# Patient Record
Sex: Female | Born: 1935 | Race: White | Hispanic: No | Smoking: Never smoker
Health system: Southern US, Community
[De-identification: ages and names within clinical notes are randomized; demographics above are authoritative.]

## PROBLEM LIST (undated history)

## (undated) DIAGNOSIS — R42 Dizziness and giddiness: Secondary | ICD-10-CM

## (undated) DIAGNOSIS — Z9889 Other specified postprocedural states: Secondary | ICD-10-CM

## (undated) DIAGNOSIS — R112 Nausea with vomiting, unspecified: Secondary | ICD-10-CM

## (undated) DIAGNOSIS — L719 Rosacea, unspecified: Secondary | ICD-10-CM

## (undated) DIAGNOSIS — M5430 Sciatica, unspecified side: Secondary | ICD-10-CM

## (undated) DIAGNOSIS — T4145XA Adverse effect of unspecified anesthetic, initial encounter: Secondary | ICD-10-CM

## (undated) DIAGNOSIS — IMO0001 Reserved for inherently not codable concepts without codable children: Secondary | ICD-10-CM

## (undated) DIAGNOSIS — M1990 Unspecified osteoarthritis, unspecified site: Secondary | ICD-10-CM

## (undated) DIAGNOSIS — I10 Essential (primary) hypertension: Secondary | ICD-10-CM

## (undated) DIAGNOSIS — E079 Disorder of thyroid, unspecified: Secondary | ICD-10-CM

## (undated) DIAGNOSIS — T8859XA Other complications of anesthesia, initial encounter: Secondary | ICD-10-CM

## (undated) DIAGNOSIS — K219 Gastro-esophageal reflux disease without esophagitis: Secondary | ICD-10-CM

## (undated) HISTORY — PX: TONSILLECTOMY: SUR1361

## (undated) HISTORY — DX: Essential (primary) hypertension: I10

## (undated) HISTORY — DX: Rosacea, unspecified: L71.9

## (undated) HISTORY — PX: BREAST EXCISIONAL BIOPSY: SUR124

## (undated) HISTORY — PX: SHOULDER SURGERY: SHX246

## (undated) HISTORY — PX: KNEE SURGERY: SHX244

## (undated) HISTORY — PX: BUNIONECTOMY: SHX129

## (undated) HISTORY — PX: THYROIDECTOMY: SHX17

## (undated) HISTORY — DX: Dizziness and giddiness: R42

## (undated) HISTORY — DX: Disorder of thyroid, unspecified: E07.9

## (undated) HISTORY — PX: ABDOMINAL HYSTERECTOMY: SHX81

---

## 1998-02-16 ENCOUNTER — Ambulatory Visit (HOSPITAL_BASED_OUTPATIENT_CLINIC_OR_DEPARTMENT_OTHER): Admission: RE | Admit: 1998-02-16 | Discharge: 1998-02-16 | Payer: Self-pay | Admitting: Orthopedic Surgery

## 1998-03-10 ENCOUNTER — Ambulatory Visit (HOSPITAL_COMMUNITY): Admission: RE | Admit: 1998-03-10 | Discharge: 1998-03-10 | Payer: Self-pay | Admitting: Obstetrics & Gynecology

## 1998-06-02 ENCOUNTER — Encounter: Admission: RE | Admit: 1998-06-02 | Discharge: 1998-08-31 | Payer: Self-pay

## 1999-06-03 ENCOUNTER — Encounter: Payer: Self-pay | Admitting: Internal Medicine

## 1999-06-03 ENCOUNTER — Encounter: Admission: RE | Admit: 1999-06-03 | Discharge: 1999-06-03 | Payer: Self-pay | Admitting: Internal Medicine

## 2000-06-06 ENCOUNTER — Encounter: Payer: Self-pay | Admitting: Internal Medicine

## 2000-06-06 ENCOUNTER — Encounter: Admission: RE | Admit: 2000-06-06 | Discharge: 2000-06-06 | Payer: Self-pay | Admitting: Internal Medicine

## 2001-06-10 ENCOUNTER — Encounter: Payer: Self-pay | Admitting: Internal Medicine

## 2001-06-10 ENCOUNTER — Encounter: Admission: RE | Admit: 2001-06-10 | Discharge: 2001-06-10 | Payer: Self-pay | Admitting: Internal Medicine

## 2002-06-13 ENCOUNTER — Encounter: Payer: Self-pay | Admitting: Internal Medicine

## 2002-06-13 ENCOUNTER — Encounter: Admission: RE | Admit: 2002-06-13 | Discharge: 2002-06-13 | Payer: Self-pay | Admitting: Internal Medicine

## 2003-07-08 ENCOUNTER — Encounter: Admission: RE | Admit: 2003-07-08 | Discharge: 2003-07-08 | Payer: Self-pay | Admitting: Internal Medicine

## 2004-07-18 ENCOUNTER — Encounter: Admission: RE | Admit: 2004-07-18 | Discharge: 2004-07-18 | Payer: Self-pay | Admitting: Internal Medicine

## 2004-07-25 ENCOUNTER — Encounter: Admission: RE | Admit: 2004-07-25 | Discharge: 2004-07-25 | Payer: Self-pay | Admitting: Internal Medicine

## 2004-08-10 ENCOUNTER — Encounter: Admission: RE | Admit: 2004-08-10 | Discharge: 2004-08-10 | Payer: Self-pay | Admitting: Internal Medicine

## 2005-01-23 ENCOUNTER — Encounter: Admission: RE | Admit: 2005-01-23 | Discharge: 2005-01-23 | Payer: Self-pay | Admitting: Internal Medicine

## 2005-06-15 ENCOUNTER — Ambulatory Visit (HOSPITAL_COMMUNITY): Admission: RE | Admit: 2005-06-15 | Discharge: 2005-06-15 | Payer: Self-pay | Admitting: Gastroenterology

## 2005-08-15 ENCOUNTER — Encounter: Admission: RE | Admit: 2005-08-15 | Discharge: 2005-08-15 | Payer: Self-pay | Admitting: Internal Medicine

## 2006-09-06 ENCOUNTER — Encounter: Admission: RE | Admit: 2006-09-06 | Discharge: 2006-09-06 | Payer: Self-pay | Admitting: Internal Medicine

## 2006-09-19 ENCOUNTER — Encounter: Admission: RE | Admit: 2006-09-19 | Discharge: 2006-09-19 | Payer: Self-pay | Admitting: Internal Medicine

## 2007-06-24 ENCOUNTER — Encounter: Admission: RE | Admit: 2007-06-24 | Discharge: 2007-06-24 | Payer: Self-pay | Admitting: Orthopedic Surgery

## 2007-07-15 ENCOUNTER — Encounter: Admission: RE | Admit: 2007-07-15 | Discharge: 2007-07-15 | Payer: Self-pay | Admitting: Internal Medicine

## 2007-07-31 ENCOUNTER — Encounter (INDEPENDENT_AMBULATORY_CARE_PROVIDER_SITE_OTHER): Payer: Self-pay | Admitting: Interventional Radiology

## 2007-07-31 ENCOUNTER — Other Ambulatory Visit: Admission: RE | Admit: 2007-07-31 | Discharge: 2007-07-31 | Payer: Self-pay | Admitting: Interventional Radiology

## 2007-07-31 ENCOUNTER — Encounter: Admission: RE | Admit: 2007-07-31 | Discharge: 2007-07-31 | Payer: Self-pay | Admitting: Internal Medicine

## 2007-10-02 ENCOUNTER — Encounter: Admission: RE | Admit: 2007-10-02 | Discharge: 2007-10-02 | Payer: Self-pay | Admitting: Otolaryngology

## 2007-10-22 ENCOUNTER — Encounter: Admission: RE | Admit: 2007-10-22 | Discharge: 2007-10-22 | Payer: Self-pay | Admitting: Family Medicine

## 2007-10-24 ENCOUNTER — Encounter (INDEPENDENT_AMBULATORY_CARE_PROVIDER_SITE_OTHER): Payer: Self-pay | Admitting: Otolaryngology

## 2007-10-24 ENCOUNTER — Observation Stay (HOSPITAL_COMMUNITY): Admission: RE | Admit: 2007-10-24 | Discharge: 2007-10-26 | Payer: Self-pay | Admitting: Otolaryngology

## 2007-11-08 ENCOUNTER — Encounter: Admission: RE | Admit: 2007-11-08 | Discharge: 2007-11-08 | Payer: Self-pay | Admitting: Otolaryngology

## 2008-03-13 ENCOUNTER — Ambulatory Visit (HOSPITAL_COMMUNITY): Admission: RE | Admit: 2008-03-13 | Discharge: 2008-03-13 | Payer: Self-pay | Admitting: Otolaryngology

## 2008-10-22 ENCOUNTER — Encounter: Admission: RE | Admit: 2008-10-22 | Discharge: 2008-10-22 | Payer: Self-pay | Admitting: Internal Medicine

## 2009-11-02 ENCOUNTER — Encounter: Admission: RE | Admit: 2009-11-02 | Discharge: 2009-11-02 | Payer: Self-pay | Admitting: Internal Medicine

## 2010-09-04 ENCOUNTER — Encounter: Payer: Self-pay | Admitting: Internal Medicine

## 2010-10-07 ENCOUNTER — Other Ambulatory Visit: Payer: Self-pay | Admitting: Internal Medicine

## 2010-10-07 DIAGNOSIS — Z1231 Encounter for screening mammogram for malignant neoplasm of breast: Secondary | ICD-10-CM

## 2010-11-04 ENCOUNTER — Ambulatory Visit
Admission: RE | Admit: 2010-11-04 | Discharge: 2010-11-04 | Disposition: A | Discharge status: Home / Self Care | Payer: PRIVATE HEALTH INSURANCE | Source: Ambulatory Visit | Attending: Internal Medicine | Admitting: Internal Medicine

## 2010-11-04 DIAGNOSIS — Z1231 Encounter for screening mammogram for malignant neoplasm of breast: Secondary | ICD-10-CM

## 2010-12-27 NOTE — Op Note (Signed)
NAMEKYLE, STANSELL                ACCOUNT NO.:  [id number]   MEDICAL RECORD NO.:  [id number]          PATIENT TYPE:  AMB   LOCATION:  SDS                          FACILITY:  [facility]   PHYSICIAN:  Jefry H. Pollyann Kennedy, MD     DATE OF BIRTH:  1926/05/31   DATE OF PROCEDURE:  03/13/2008  DATE OF DISCHARGE:  03/13/2008                               OPERATIVE REPORT   PREOPERATIVE DIAGNOSIS:  Left vocal cord paralysis with laryngeal  incompetence.   POSTOPERATIVE DIAGNOSIS:  Left vocal cord paralysis with laryngeal  incompetence.   PROCEDURE:  Injection of laryngoplasty of left vocal cord using Radiesse  brand hydroxyapatite injection.   SURGEON:  Jefry H. Pollyann Kennedy, MD   ANESTHESIA:  General endotracheal anesthesia was used.   COMPLICATIONS:  None.   BLOOD LOSS:  None.   FINDINGS:  Left paralyzed vocal cords in the paramedian position.  At  the termination of the procedure, there was nice fullness of the cord  with a very tiny posterior glottic chink still remaining.  No  complications.   HISTORY:  This is a 77 year old who underwent completion thyroidectomy  several months back.  She has had a left vocal cord paralysis and severe  breathiness to her voice since then.  Risks, benefits, alternatives, and  complications of the procedure were explained to the patient, who seem  to understand and agreed with surgery.   PROCEDURE:  The patient was taken to the operating room and placed on  the operating table in supine position.  Following induction of general  endotracheal anesthesia, the table was turned 90 degrees and the patient  was draped in the standard fashion.  A maxillary tooth protector was  used.  A Jako laryngoscope was used to view the larynx and this was  carried to the mediastinum using suspension apparatus.  The endotracheal  tube was removed and then replaced as needed during the case.  Using  operative technique, the radius injection system was used to inject in  two  sites, one in the mid portion of the vocal cord deep to the lamina  propria of the cord and second place was posteriorly along the retinoid  vocal process.  Total of 0.2 mL was injected.  There was nice fullness  of the cord, nice even contacting surface and a very small posterior  glottic chink remaining while the cords were in the midline position.  The laryngoscope was removed.  The patient was reintubated for the  termination of the procedures.  She was allowed to ventilate, awakened  from anesthesia, and was extubated.      Jefry H. Pollyann Kennedy, MD  Electronically Signed    JHR/MEDQ  D:  03/13/2008  T:  03/14/2008  Job:  [id number]

## 2010-12-27 NOTE — Op Note (Signed)
NAMECURTINA, GRILLS                ACCOUNT NO.:  [id number]   MEDICAL RECORD NO.:  [id number]          PATIENT TYPE:  OIB   LOCATION:  5125                         FACILITY:  [facility]   PHYSICIAN:  Jefry H. Pollyann Kennedy, MD     DATE OF BIRTH:  18-Jan-1926   DATE OF PROCEDURE:  DATE OF DISCHARGE:  10/26/2007                               OPERATIVE REPORT   PREOPERATIVE DIAGNOSIS:  Left thyroid mass.   POSTOPERATIVE DIAGNOSIS:  Left thyroid mass.   PROCEDURE:  Left completion thyroidectomy.   SURGEON:  Jefry H. Pollyann Kennedy, MD   ASSISTANT:  Suzanna Obey, M.D.   ANESTHESIA:  General endotracheal.   COMPLICATIONS:  None.   BLOOD LOSS:  Minimal.   FINDINGS:  Multilobulated left-sided thyroid mass totaling approximately  6-cm.  A punitive inferior parathyroid gland was identified and  preserved with its blood supply.  The recurrent nerve was not identified  separately.   HISTORY:  This is a 77 year old lady who underwent right-sided  thyroidectomy for benign disease about 20 years previously.  She has had  an enlarging mass on the left side that has caused compression of the  trachea and some difficulty with shortness of breath.  Risks, benefits,  alternatives, and complications of the procedure explained to the  patient and she seemed to understand and agreed to the surgery.   PROCEDURE:  The patient was taken to the operating room and placed on  the operating table in the supine position.  Following induction of  general endotracheal anesthesia, the neck was prepped and draped in the  standard fashion.  The previous incision scar was used and  electrocautery was used for incision of the skin and subcutaneous  tissue.  Subplatysmal flaps were developed superiorly to the thyroid  notch and inferiorly to the clavicle.  A self-retaining thyroid  retractor was used throughout the case.  The midline fascia was divided.  The strap muscles were dissected laterally on the left side off of the  thyroid  gland.  The thyroid gland was grasped with Babcock forceps and  was retracted medially while the dissection was accomplished.  The  middle thyroid vein was ligated between clamps and divided.  The  superior vasculature was separately identified, dissected, and ligated  between clamps and divided.  The dissection continued down right on the  capsule working inferiorly towards the inferior vasculature.  The  punitive parathyroid gland was thus identified and preserved with blood  supply.  It remained healthy and viable appearing throughout the  procedure.  The ligament of Allyson Sabal was divided using electrocautery.  The  specimen was delivered from the wound.  The wound was irrigated with  saline and bipolar cautery was used for completion of hemostasis.  The  wound was closed in layers using 4-0 chromic on the midline fascia in  the platysmal layer and  Dermabond on the skin.  A 7 [place] round JP drain was left in the wound  and exited through the right side of the incision and secured in place  with a nylon suture.  The patient was awakened, extubated,  and  transferred to recovery in stable condition.      Jefry H. Pollyann Kennedy, MD  Electronically Signed     JHR/MEDQ  D:  10/24/2007  T:  10/24/2007  Job:  [id number]   cc:   Suzanna Obey, M.D.

## 2010-12-30 NOTE — Op Note (Signed)
NAMEOTILIA, Pamela Braun                ACCOUNT NO.:  [id number]   MEDICAL RECORD NO.:  [id number]          PATIENT TYPE:  AMB   LOCATION:  ENDO                         FACILITY:  [facility]   PHYSICIAN:  James L. Malon Kindle., M.D.DATE OF BIRTH:  1925/12/02   DATE OF PROCEDURE:  06/15/2005  DATE OF DISCHARGE:                                 OPERATIVE REPORT   PROCEDURE:  Colonoscopy.   MEDICATIONS:  Not given to me.  Patient did receive a large amount of  medications.  Please see the nurse's notes for the exact amount.   SCOPE:  Pediatric adjustable scope.   INDICATIONS:  Previous history of adenomatous polyp removed.   DESCRIPTION OF PROCEDURE:  The procedure has been explained to the patient  and consent obtained.  In left lateral decubitus position, the pediatric  adjustable scope was inserted.  The patient had scattered diverticula.  A  long, tortuous colon.  Multiple maneuvers were required.  We had to place  her in the supine and in the right lateral decubitus position using  abdominal pressure.  Back in the supine, back in the left lateral decubitus  position.  Ultimately, we were able to work our way to the cecum.  The  ileocecal valve and appendiceal orifice seen.  The scope was withdrawn.  The  colon carefully examined.  No polyps seen throughout the entire colon.  There was a fairly long, tortuous colon and only scattered diverticula were  seen.  The scope was withdrawn.  The patient was resting comfortably at the  termination of the procedure.   ASSESSMENT:  A history of colon polyps with a negative colonoscopy at this  time.   Due to her age and the fact that she will be over 82 at the time we would  normally do another five year colonoscopy, we will recommend yearly  Hemoccults and repeat colonoscopies for specific indications, such as heme-  positive stools.           ______________________________  Llana Aliment Malon Kindle., M.D.     Waldron Session  D:  06/15/2005  T:   06/15/2005  Job:  [id number]   cc:   Larina Earthly, M.D.  Fax: [phone]

## 2011-03-27 ENCOUNTER — Other Ambulatory Visit: Payer: Self-pay | Admitting: Family Medicine

## 2011-03-27 ENCOUNTER — Ambulatory Visit
Admission: RE | Admit: 2011-03-27 | Discharge: 2011-03-27 | Disposition: A | Discharge status: Home / Self Care | Payer: PRIVATE HEALTH INSURANCE | Source: Ambulatory Visit | Attending: Family Medicine | Admitting: Family Medicine

## 2011-03-27 DIAGNOSIS — R0602 Shortness of breath: Secondary | ICD-10-CM

## 2011-05-08 LAB — CALCIUM
Calcium: 7.4 — ABNORMAL LOW
Calcium: 7.7 — ABNORMAL LOW
Calcium: 7.9 — ABNORMAL LOW
Calcium: 8 — ABNORMAL LOW
Calcium: 8 — ABNORMAL LOW

## 2011-05-08 LAB — BASIC METABOLIC PANEL
BUN: 18
CO2: 29
Calcium: 9.1
Chloride: 104
Creatinine, Ser: 0.81
GFR calc Af Amer: >60
GFR calc non Af Amer: >60
Glucose, Bld: 99
Potassium: 4.4
Sodium: 141

## 2011-05-08 LAB — CBC
HCT: 39.5
Hemoglobin: 13.3
MCHC: 33.7
MCV: 81.3
Platelets: 211
RBC: 4.86
RDW: 13.6
WBC: 7.6

## 2011-05-12 LAB — CBC
Platelets: 183
WBC: 6.6

## 2011-05-12 LAB — BASIC METABOLIC PANEL
BUN: 21
Creatinine, Ser: 0.83
GFR calc non Af Amer: >60

## 2011-10-16 ENCOUNTER — Other Ambulatory Visit: Payer: Self-pay | Admitting: Family Medicine

## 2011-10-16 DIAGNOSIS — Z1231 Encounter for screening mammogram for malignant neoplasm of breast: Secondary | ICD-10-CM

## 2011-11-08 ENCOUNTER — Ambulatory Visit: Payer: PRIVATE HEALTH INSURANCE

## 2011-11-09 ENCOUNTER — Ambulatory Visit
Admission: RE | Admit: 2011-11-09 | Discharge: 2011-11-09 | Disposition: A | Discharge status: Home / Self Care | Payer: PRIVATE HEALTH INSURANCE | Source: Ambulatory Visit | Attending: Family Medicine | Admitting: Family Medicine

## 2011-11-09 DIAGNOSIS — Z1231 Encounter for screening mammogram for malignant neoplasm of breast: Secondary | ICD-10-CM

## 2012-04-26 ENCOUNTER — Ambulatory Visit
Admission: RE | Admit: 2012-04-26 | Discharge: 2012-04-26 | Disposition: A | Discharge status: Home / Self Care | Payer: Medicare Other | Source: Ambulatory Visit | Attending: Family Medicine | Admitting: Family Medicine

## 2012-04-26 ENCOUNTER — Other Ambulatory Visit: Payer: Self-pay | Admitting: Family Medicine

## 2012-04-26 DIAGNOSIS — M79606 Pain in leg, unspecified: Secondary | ICD-10-CM

## 2012-04-26 DIAGNOSIS — R609 Edema, unspecified: Secondary | ICD-10-CM

## 2012-04-26 DIAGNOSIS — O2230 Deep phlebothrombosis in pregnancy, unspecified trimester: Secondary | ICD-10-CM

## 2012-10-21 ENCOUNTER — Other Ambulatory Visit: Payer: Self-pay

## 2012-10-21 ENCOUNTER — Other Ambulatory Visit: Payer: Self-pay | Admitting: Family Medicine

## 2012-10-21 DIAGNOSIS — Z1231 Encounter for screening mammogram for malignant neoplasm of breast: Secondary | ICD-10-CM

## 2012-10-21 DIAGNOSIS — R102 Pelvic and perineal pain: Secondary | ICD-10-CM

## 2012-10-22 ENCOUNTER — Ambulatory Visit
Admission: RE | Admit: 2012-10-22 | Discharge: 2012-10-22 | Disposition: A | Discharge status: Home / Self Care | Payer: Medicare Other | Source: Ambulatory Visit | Attending: Family Medicine | Admitting: Family Medicine

## 2012-10-22 DIAGNOSIS — R102 Pelvic and perineal pain: Secondary | ICD-10-CM

## 2012-11-11 ENCOUNTER — Ambulatory Visit
Admission: RE | Admit: 2012-11-11 | Discharge: 2012-11-11 | Disposition: A | Discharge status: Home / Self Care | Payer: Medicare Other | Source: Ambulatory Visit

## 2012-11-11 DIAGNOSIS — Z1231 Encounter for screening mammogram for malignant neoplasm of breast: Secondary | ICD-10-CM

## 2013-06-20 ENCOUNTER — Ambulatory Visit
Admission: RE | Admit: 2013-06-20 | Discharge: 2013-06-20 | Disposition: A | Discharge status: Home / Self Care | Payer: Medicare Other | Source: Ambulatory Visit | Attending: Family Medicine | Admitting: Family Medicine

## 2013-06-20 ENCOUNTER — Other Ambulatory Visit: Payer: Self-pay | Admitting: Family Medicine

## 2013-06-20 DIAGNOSIS — R059 Cough, unspecified: Secondary | ICD-10-CM

## 2013-06-20 DIAGNOSIS — R05 Cough: Secondary | ICD-10-CM

## 2013-10-21 ENCOUNTER — Other Ambulatory Visit: Payer: Self-pay

## 2013-10-21 DIAGNOSIS — Z1231 Encounter for screening mammogram for malignant neoplasm of breast: Secondary | ICD-10-CM

## 2013-11-13 ENCOUNTER — Ambulatory Visit
Admission: RE | Admit: 2013-11-13 | Discharge: 2013-11-13 | Disposition: A | Discharge status: Home / Self Care | Payer: Medicare HMO | Source: Ambulatory Visit

## 2013-11-13 DIAGNOSIS — Z1231 Encounter for screening mammogram for malignant neoplasm of breast: Secondary | ICD-10-CM

## 2014-09-24 ENCOUNTER — Encounter: Payer: Self-pay | Admitting: Neurology

## 2014-09-24 ENCOUNTER — Ambulatory Visit (INDEPENDENT_AMBULATORY_CARE_PROVIDER_SITE_OTHER): Payer: Medicare HMO | Admitting: Neurology

## 2014-09-24 VITALS — BP 172/81 | HR 67 | Ht 63.0 in | Wt 182.0 lb

## 2014-09-24 DIAGNOSIS — H8112 Benign paroxysmal vertigo, left ear: Secondary | ICD-10-CM

## 2014-09-24 NOTE — Progress Notes (Signed)
PATIENT: Pamela Braun DOB: 1926-05-12  HISTORICAL  Pamela Braun is a 81 year old right-handed female, referred by Dr. Melford Aase for evaluation of vertigo episodes.  She has history of hypothyroidism, hypertension, take daily aspirin.  She presented with of vertigo since January 2016, most noticeable with sudden positional change, such as turning in her bed to the left side, intense spinning sensation lasting for a few minutes, sometimes with nausea, no visual change, no gait change, she has some gait difficulty due to her low back pain, bilateral shoulder pain.  Her symptoms are intermittent, no hearing loss, no visual change, no lateralized motor or sensory deficit.  I have reviewed her MRI in February first 2016, moderate atrophy, periventricular small vessel disease, no acute lesions.  REVIEW OF SYSTEMS: Full 14 system review of systems performed and notable only for fatigue, shortness of breath, easy bruising, easy bleeding, joint pain, dizziness, restless leg.  ALLERGIES: Allergies  Allergen Reactions  . Anesthetics, Amide Nausea And Vomiting  . Celecoxib Rash  . Codeine Nausea And Vomiting  . Ibuprofen Swelling  . Meperidine Nausea And Vomiting  . Naproxen Swelling  . Sulfamethoxazole-Trimethoprim Rash  . Naloxone Nausea And Vomiting  . Pentazocine Nausea And Vomiting  . Azithromycin Photosensitivity    HOME MEDICATIONS: Current Outpatient Prescriptions  Medication Sig Dispense Refill  . acetaminophen (TYLENOL) 500 MG tablet Take 500 mg by mouth.    Marland Kitchen aspirin 81 MG chewable tablet Chew 81 mg by mouth.    . Calcium-Vitamin D-Vitamin K 500-500-40 MG-UNT-MCG CHEW Chew 500 mg by mouth.    . diazepam (VALIUM) 2 MG tablet Take 2 mg by mouth as needed.    . diclofenac sodium (VOLTAREN) 1 % GEL Place onto the skin as needed.    Marland Kitchen levothyroxine (SYNTHROID, LEVOTHROID) 137 MCG tablet Take 137 mcg by mouth.    . metoprolol (LOPRESSOR) 50 MG tablet Take 50 mg by mouth.    .  Multiple Vitamins-Minerals (VISION-VITE PRESERVE PO) Take by mouth.    . promethazine (PHENERGAN) 25 MG tablet Take 25 mg by mouth.    . ranitidine (ZANTAC) 150 MG tablet Take 150 mg by mouth.     No current facility-administered medications for this visit.    PAST MEDICAL HISTORY: Past Medical History  Diagnosis Date  . Hypertension   . Vertigo   . Rosacea   . Thyroid disease     PAST SURGICAL HISTORY: Past Surgical History  Procedure Laterality Date  . Thyroidectomy    . Shoulder surgery      Bilateral  . Knee surgery      Right x 2  . Tonsillectomy      FAMILY HISTORY: No family history on file.  SOCIAL HISTORY:  History   Social History  . Marital Status: Widowed    Spouse Name: N/A  . Number of Children: 4  . Years of Education: 12   Occupational History  . Retired    Social History Main Topics  . Smoking status: Never Smoker   . Smokeless tobacco: Not on file  . Alcohol Use: No  . Drug Use: No  . Sexual Activity: Not on file   Other Topics Concern  . Not on file   Social History Narrative   Lives at home.   Her daughter lives with her.   Right-handed.   2 -3 cups caffeine/day     PHYSICAL EXAM   Filed Vitals:   09/24/14 1003  BP: 172/81  Pulse: 67  Height: 5'  3" (1.6 m)  Weight: 182 lb (82.555 kg)    Not recorded      Body mass index is 32.25 kg/(m^2).   Generalized: In no acute distress  Neck: Supple, no carotid bruits   Cardiac: Regular rate rhythm  Pulmonary: Clear to auscultation bilaterally  Musculoskeletal: Limited range of motion of bilateral shoulders.  Neurological examination  Mentation: Alert oriented to time, place, history taking, and causual conversation  Cranial nerve II-XII: Pupils were equal round reactive to light. Extraocular movements were full.  Visual field were full on confrontational test. Bilateral fundi were sharp.  Facial sensation and strength were normal. Hearing was intact to finger rubbing  bilaterally. Uvula tongue midline.  Head turning and shoulder shrug and were normal and symmetric.Tongue protrusion into cheek strength was normal.  Motor: Normal tone, bulk and strength.  Sensory: Intact to fine touch, pinprick, preserved vibratory sensation, and proprioception at toes.  Coordination: Normal finger to nose, heel-to-shin bilaterally there was no truncal ataxia  Gait: Rising up from seated position by pushing on a chair arm, cautious, antalgic gait   Romberg signs: Negative  Deep tendon reflexes: Brachioradialis 2/2, biceps 2/2, triceps 2/2, patellar 2/2, Achilles 2/2, plantar responses were flexor bilaterally.  I performed Apley's maneuver, with left ear dependent position, she developed transient dizziness, I was able to appreciate short lasting rotatory nystagmus, quickly habituate,  DIAGNOSTIC DATA (LABS, IMAGING, TESTING) - I reviewed patient records, labs, notes, testing and imaging myself where available.  Lab Results  Component Value Date   WBC 6.6 03/11/2008   HGB 12.4 03/11/2008   HCT 37.2 03/11/2008   MCV 83.4 03/11/2008   PLT 183 03/11/2008      Component Value Date/Time   NA 140 03/11/2008 0940   K 4.6 03/11/2008 0940   CL 106 03/11/2008 0940   CO2 28 03/11/2008 0940   GLUCOSE 99 03/11/2008 0940   BUN 21 03/11/2008 0940   CREATININE 0.83 03/11/2008 0940   CALCIUM 8.6 03/11/2008 0940   GFRNONAA >60 03/11/2008 0940   GFRAA  03/11/2008 0940    >60        The eGFR has been calculated using the MDRD equation. This calculation has not been validated in all clinical   No results found for: CHOL, HDL, LDLCALC, LDLDIRECT, TRIG, CHOLHDL No results found for: HGBA1C No results found for: VITAMINB12 No results found for: TSH    ASSESSMENT AND PLAN  Pamela Braun is a 81 y.o. female  with vascular risk factor of age, hypertension, hypothyroidism, presenting with intermittent intense of vertigo, positional related, triggered by left ear dependent  position, consistent with benign positional vertigo, I was able to trigger vertigo, and nystagmus,MRI of brain showed moderate atrophy, small vessel disease  Repeat repositional maneuver Return to clinic in one month   Marcial Pacas, M.D. Ph.D.  [facility] [address], [place], [place] [address] Ph: [phone] Fax: [phone]

## 2014-09-24 NOTE — Patient Instructions (Signed)
Benign Positional Vertigo Vertigo means you feel like you or your surroundings are moving when they are not. Benign positional vertigo is the most common form of vertigo. Benign means that the cause of your condition is not serious. Benign positional vertigo is more common in older adults. CAUSES  Benign positional vertigo is the result of an upset in the labyrinth system. This is an area in the middle ear that helps control your balance. This may be caused by a viral infection, head injury, or repetitive motion. However, often no specific cause is found. SYMPTOMS  Symptoms of benign positional vertigo occur when you move your head or eyes in different directions. Some of the symptoms may include:  Loss of balance and falls.  Vomiting.  Blurred vision.  Dizziness.  Nausea.  Involuntary eye movements (nystagmus). DIAGNOSIS  Benign positional vertigo is usually diagnosed by physical exam. If the specific cause of your benign positional vertigo is unknown, your caregiver may perform imaging tests, such as magnetic resonance imaging (MRI) or computed tomography (CT). TREATMENT  Your caregiver may recommend movements or procedures to correct the benign positional vertigo. Medicines such as meclizine, benzodiazepines, and medicines for nausea may be used to treat your symptoms. In rare cases, if your symptoms are caused by certain conditions that affect the inner ear, you may need surgery. HOME CARE INSTRUCTIONS   Follow your caregiver's instructions.  Move slowly. Do not make sudden body or head movements.  Avoid driving.  Avoid operating heavy machinery.  Avoid performing any tasks that would be dangerous to you or others during a vertigo episode.  Drink enough fluids to keep your urine clear or pale yellow. SEEK IMMEDIATE MEDICAL CARE IF:   You develop problems with walking, weakness, numbness, or using your arms, hands, or legs.  You have difficulty speaking.  You develop  severe headaches.  Your nausea or vomiting continues or gets worse.  You develop visual changes.  Your family or friends notice any behavioral changes.  Your condition gets worse.  You have a fever.  You develop a stiff neck or sensitivity to light. MAKE SURE YOU:   Understand these instructions.  Will watch your condition.  Will get help right away if you are not doing well or get worse. Document Released: 05/08/2006 Document Revised: 10/23/2011 Document Reviewed: 04/20/2011 Exit[employer] Patient Information 2015 [employer]. This information is not intended to replace advice given to you by your health care provider. Make sure you discuss any questions you have with your health care provider.    

## 2014-10-29 ENCOUNTER — Ambulatory Visit: Payer: Medicare HMO | Admitting: Neurology

## 2014-11-03 ENCOUNTER — Other Ambulatory Visit: Payer: Self-pay

## 2014-11-03 DIAGNOSIS — Z1231 Encounter for screening mammogram for malignant neoplasm of breast: Secondary | ICD-10-CM

## 2014-11-16 ENCOUNTER — Ambulatory Visit: Payer: Medicare HMO

## 2014-11-23 ENCOUNTER — Ambulatory Visit
Admission: RE | Admit: 2014-11-23 | Discharge: 2014-11-23 | Disposition: A | Discharge status: Home / Self Care | Payer: Medicare HMO | Source: Ambulatory Visit

## 2014-11-23 DIAGNOSIS — Z1231 Encounter for screening mammogram for malignant neoplasm of breast: Secondary | ICD-10-CM

## 2015-08-14 ENCOUNTER — Encounter (HOSPITAL_COMMUNITY)
Admission: EM | Disposition: A | Discharge status: Home Health | Payer: Self-pay | Source: Home / Self Care | Attending: Emergency Medicine

## 2015-08-14 ENCOUNTER — Emergency Department (HOSPITAL_COMMUNITY): Payer: Medicare HMO | Admitting: Anesthesiology

## 2015-08-14 ENCOUNTER — Emergency Department (HOSPITAL_COMMUNITY): Payer: Medicare HMO

## 2015-08-14 ENCOUNTER — Encounter (HOSPITAL_COMMUNITY): Payer: Self-pay | Admitting: *Deleted

## 2015-08-14 ENCOUNTER — Observation Stay (HOSPITAL_COMMUNITY)
Admission: EM | Admit: 2015-08-14 | Discharge: 2015-08-18 | Disposition: A | Discharge status: Home Health | Payer: Medicare HMO | Attending: Orthopedic Surgery | Admitting: Orthopedic Surgery

## 2015-08-14 DIAGNOSIS — W010XXA Fall on same level from slipping, tripping and stumbling without subsequent striking against object, initial encounter: Secondary | ICD-10-CM | POA: Insufficient documentation

## 2015-08-14 DIAGNOSIS — M1712 Unilateral primary osteoarthritis, left knee: Secondary | ICD-10-CM | POA: Diagnosis not present

## 2015-08-14 DIAGNOSIS — E039 Hypothyroidism, unspecified: Secondary | ICD-10-CM | POA: Insufficient documentation

## 2015-08-14 DIAGNOSIS — M179 Osteoarthritis of knee, unspecified: Secondary | ICD-10-CM | POA: Diagnosis not present

## 2015-08-14 DIAGNOSIS — E669 Obesity, unspecified: Secondary | ICD-10-CM | POA: Diagnosis not present

## 2015-08-14 DIAGNOSIS — S52502A Unspecified fracture of the lower end of left radius, initial encounter for closed fracture: Secondary | ICD-10-CM

## 2015-08-14 DIAGNOSIS — R202 Paresthesia of skin: Secondary | ICD-10-CM

## 2015-08-14 DIAGNOSIS — T79A12A Traumatic compartment syndrome of left upper extremity, initial encounter: Secondary | ICD-10-CM | POA: Insufficient documentation

## 2015-08-14 DIAGNOSIS — Y92009 Unspecified place in unspecified non-institutional (private) residence as the place of occurrence of the external cause: Secondary | ICD-10-CM | POA: Diagnosis not present

## 2015-08-14 DIAGNOSIS — M25562 Pain in left knee: Secondary | ICD-10-CM

## 2015-08-14 DIAGNOSIS — M25532 Pain in left wrist: Secondary | ICD-10-CM | POA: Diagnosis present

## 2015-08-14 DIAGNOSIS — Z7982 Long term (current) use of aspirin: Secondary | ICD-10-CM | POA: Insufficient documentation

## 2015-08-14 DIAGNOSIS — K219 Gastro-esophageal reflux disease without esophagitis: Secondary | ICD-10-CM | POA: Insufficient documentation

## 2015-08-14 DIAGNOSIS — M25462 Effusion, left knee: Secondary | ICD-10-CM

## 2015-08-14 DIAGNOSIS — Z6830 Body mass index (BMI) 30.0-30.9, adult: Secondary | ICD-10-CM | POA: Diagnosis not present

## 2015-08-14 DIAGNOSIS — R52 Pain, unspecified: Secondary | ICD-10-CM

## 2015-08-14 DIAGNOSIS — G5602 Carpal tunnel syndrome, left upper limb: Secondary | ICD-10-CM | POA: Insufficient documentation

## 2015-08-14 DIAGNOSIS — Z79899 Other long term (current) drug therapy: Secondary | ICD-10-CM | POA: Diagnosis not present

## 2015-08-14 DIAGNOSIS — I10 Essential (primary) hypertension: Secondary | ICD-10-CM | POA: Insufficient documentation

## 2015-08-14 DIAGNOSIS — S52572A Other intraarticular fracture of lower end of left radius, initial encounter for closed fracture: Secondary | ICD-10-CM | POA: Diagnosis not present

## 2015-08-14 HISTORY — DX: Sciatica, unspecified side: M54.30

## 2015-08-14 HISTORY — DX: Unspecified osteoarthritis, unspecified site: M19.90

## 2015-08-14 HISTORY — DX: Other specified postprocedural states: Z98.890

## 2015-08-14 HISTORY — DX: Reserved for inherently not codable concepts without codable children: IMO0001

## 2015-08-14 HISTORY — PX: ORIF WRIST FRACTURE: SHX2133

## 2015-08-14 HISTORY — DX: Gastro-esophageal reflux disease without esophagitis: K21.9

## 2015-08-14 HISTORY — DX: Other complications of anesthesia, initial encounter: T88.59XA

## 2015-08-14 HISTORY — DX: Nausea with vomiting, unspecified: R11.2

## 2015-08-14 HISTORY — DX: Adverse effect of unspecified anesthetic, initial encounter: T41.45XA

## 2015-08-14 LAB — BASIC METABOLIC PANEL
Anion gap: 13 (ref 5–15)
BUN: 25 mg/dL — AB (ref 6–20)
CO2: 23 mmol/L (ref 22–32)
CREATININE: 0.86 mg/dL (ref 0.44–1.00)
Calcium: 9.5 mg/dL (ref 8.9–10.3)
Chloride: 103 mmol/L (ref 101–111)
GFR, EST NON AFRICAN AMERICAN: 59 mL/min — AB (ref >60)
Glucose, Bld: 136 mg/dL — ABNORMAL HIGH (ref 65–99)
Potassium: 4.3 mmol/L (ref 3.5–5.1)
Sodium: 139 mmol/L (ref 135–145)

## 2015-08-14 LAB — CBC WITH DIFFERENTIAL/PLATELET
Basophils Absolute: 0 10*3/uL (ref 0.0–0.1)
Basophils Relative: 0 %
EOS PCT: 1 %
Eosinophils Absolute: 0.1 10*3/uL (ref 0.0–0.7)
HCT: 37.2 % (ref 36.0–46.0)
HEMOGLOBIN: 12.1 g/dL (ref 12.0–15.0)
LYMPHS ABS: 1.4 10*3/uL (ref 0.7–4.0)
LYMPHS PCT: 14 %
MCH: 26.7 pg (ref 26.0–34.0)
MCHC: 32.5 g/dL (ref 30.0–36.0)
MCV: 81.9 fL (ref 78.0–100.0)
MONOS PCT: 9 %
Monocytes Absolute: 0.9 10*3/uL (ref 0.1–1.0)
Neutro Abs: 7.7 10*3/uL (ref 1.7–7.7)
Neutrophils Relative %: 76 %
PLATELETS: 156 10*3/uL (ref 150–400)
RBC: 4.54 MIL/uL (ref 3.87–5.11)
RDW: 14.6 % (ref 11.5–15.5)
WBC: 10.2 10*3/uL (ref 4.0–10.5)

## 2015-08-14 SURGERY — OPEN REDUCTION INTERNAL FIXATION (ORIF) WRIST FRACTURE
Anesthesia: General | Site: Arm Lower | Laterality: Left

## 2015-08-14 SURGERY — OPEN REDUCTION INTERNAL FIXATION (ORIF) WRIST FRACTURE
Anesthesia: General | Laterality: Left

## 2015-08-14 MED ORDER — HYDROMORPHONE HCL 1 MG/ML IJ SOLN
0.5000 mg | Freq: Once | INTRAMUSCULAR | Status: AC
  Administered 2015-08-14: 0.5 mg via INTRAVENOUS
  Filled 2015-08-14: qty 1

## 2015-08-14 MED ORDER — ONDANSETRON HCL 4 MG/2ML IJ SOLN
INTRAMUSCULAR | Status: AC
  Filled 2015-08-14: qty 2

## 2015-08-14 MED ORDER — ARTIFICIAL TEARS OP OINT
TOPICAL_OINTMENT | OPHTHALMIC | Status: AC
  Filled 2015-08-14: qty 3.5

## 2015-08-14 MED ORDER — SUCCINYLCHOLINE CHLORIDE 20 MG/ML IJ SOLN
INTRAMUSCULAR | Status: AC
  Filled 2015-08-14: qty 2

## 2015-08-14 MED ORDER — PROPOFOL 10 MG/ML IV BOLUS
INTRAVENOUS | Status: AC
  Filled 2015-08-14: qty 20

## 2015-08-14 MED ORDER — FENTANYL CITRATE (PF) 250 MCG/5ML IJ SOLN
INTRAMUSCULAR | Status: DC | PRN
  Administered 2015-08-14 (×3): 50 ug via INTRAVENOUS

## 2015-08-14 MED ORDER — PROPOFOL 10 MG/ML IV BOLUS
INTRAVENOUS | Status: DC | PRN
  Administered 2015-08-14: 100 mg via INTRAVENOUS

## 2015-08-14 MED ORDER — LIDOCAINE HCL (CARDIAC) 20 MG/ML IV SOLN
INTRAVENOUS | Status: AC
  Filled 2015-08-14: qty 5

## 2015-08-14 MED ORDER — GLYCOPYRROLATE 0.2 MG/ML IJ SOLN
INTRAMUSCULAR | Status: AC
  Filled 2015-08-14: qty 3

## 2015-08-14 MED ORDER — CEFAZOLIN SODIUM-DEXTROSE 2-3 GM-% IV SOLR
INTRAVENOUS | Status: AC
Start: 2015-08-14 — End: 2015-08-14
  Filled 2015-08-14: qty 100

## 2015-08-14 MED ORDER — DEXAMETHASONE SODIUM PHOSPHATE 4 MG/ML IJ SOLN
INTRAMUSCULAR | Status: DC | PRN
  Administered 2015-08-14: 4 mg via INTRAVENOUS

## 2015-08-14 MED ORDER — ROCURONIUM BROMIDE 50 MG/5ML IV SOLN
INTRAVENOUS | Status: AC
  Filled 2015-08-14: qty 1

## 2015-08-14 MED ORDER — PHENYLEPHRINE 40 MCG/ML (10ML) SYRINGE FOR IV PUSH (FOR BLOOD PRESSURE SUPPORT)
PREFILLED_SYRINGE | INTRAVENOUS | Status: AC
  Filled 2015-08-14: qty 10

## 2015-08-14 MED ORDER — 0.9 % SODIUM CHLORIDE (POUR BTL) OPTIME
TOPICAL | Status: DC | PRN
  Administered 2015-08-14: 1000 mL

## 2015-08-14 MED ORDER — CEFAZOLIN SODIUM-DEXTROSE 2-3 GM-% IV SOLR
INTRAVENOUS | Status: DC | PRN
  Administered 2015-08-14: 2 g via INTRAVENOUS

## 2015-08-14 MED ORDER — LACTATED RINGERS IV SOLN
INTRAVENOUS | Status: DC | PRN
  Administered 2015-08-14: 23:00:00 via INTRAVENOUS

## 2015-08-14 MED ORDER — SUCCINYLCHOLINE CHLORIDE 20 MG/ML IJ SOLN
INTRAMUSCULAR | Status: DC | PRN
  Administered 2015-08-14: 80 mg via INTRAVENOUS

## 2015-08-14 MED ORDER — NEOSTIGMINE METHYLSULFATE 10 MG/10ML IV SOLN
INTRAVENOUS | Status: AC
  Filled 2015-08-14: qty 1

## 2015-08-14 MED ORDER — FENTANYL CITRATE (PF) 250 MCG/5ML IJ SOLN
INTRAMUSCULAR | Status: AC
  Filled 2015-08-14: qty 5

## 2015-08-14 MED ORDER — PHENYLEPHRINE HCL 10 MG/ML IJ SOLN
INTRAMUSCULAR | Status: AC
  Filled 2015-08-14: qty 1

## 2015-08-14 SURGICAL SUPPLY — 70 items
BANDAGE ACE 4X5 VEL STRL LF (GAUZE/BANDAGES/DRESSINGS) ×2 IMPLANT
BANDAGE ELASTIC 3 VELCRO ST LF (GAUZE/BANDAGES/DRESSINGS) ×2 IMPLANT
BANDAGE ELASTIC 4 VELCRO ST LF (GAUZE/BANDAGES/DRESSINGS) ×2 IMPLANT
BIT DRILL 2.2 SS TIBIAL (BIT) ×2 IMPLANT
BLADE SURG ROTATE 9660 (MISCELLANEOUS) IMPLANT
BNDG COHESIVE 4X5 TAN NS LF (GAUZE/BANDAGES/DRESSINGS) ×2 IMPLANT
BNDG ESMARK 4X9 LF (GAUZE/BANDAGES/DRESSINGS) ×2 IMPLANT
BNDG GAUZE ELAST 4 BULKY (GAUZE/BANDAGES/DRESSINGS) ×8 IMPLANT
CANISTER SUCTION 2500CC (MISCELLANEOUS) ×2 IMPLANT
CORDS BIPOLAR (ELECTRODE) ×2 IMPLANT
COVER SURGICAL LIGHT HANDLE (MISCELLANEOUS) ×2 IMPLANT
CUFF TOURNIQUET SINGLE 18IN (TOURNIQUET CUFF) ×2 IMPLANT
CUFF TOURNIQUET SINGLE 24IN (TOURNIQUET CUFF) ×2 IMPLANT
DRAIN TLS ROUND 10FR (DRAIN) ×2 IMPLANT
DRAPE OEC MINIVIEW 54X84 (DRAPES) ×2 IMPLANT
DRAPE SURG 17X23 STRL (DRAPES) ×2 IMPLANT
DRSG ADAPTIC 3X8 NADH LF (GAUZE/BANDAGES/DRESSINGS) ×2 IMPLANT
GAUZE SPONGE 4X4 12PLY STRL (GAUZE/BANDAGES/DRESSINGS) ×4 IMPLANT
GAUZE XEROFORM 1X8 LF (GAUZE/BANDAGES/DRESSINGS) ×2 IMPLANT
GAUZE XEROFORM 5X9 LF (GAUZE/BANDAGES/DRESSINGS) ×2 IMPLANT
GLOVE BIO SURGEON STRL SZ7 (GLOVE) ×2 IMPLANT
GLOVE BIOGEL M STRL SZ7.5 (GLOVE) ×2 IMPLANT
GLOVE BIOGEL PI IND STRL 7.0 (GLOVE) ×1 IMPLANT
GLOVE BIOGEL PI INDICATOR 7.0 (GLOVE) ×1
GLOVE SS BIOGEL STRL SZ 8 (GLOVE) ×2 IMPLANT
GLOVE SUPERSENSE BIOGEL SZ 8 (GLOVE) ×2
GOWN STRL REUS W/ TWL LRG LVL3 (GOWN DISPOSABLE) ×1 IMPLANT
GOWN STRL REUS W/ TWL XL LVL3 (GOWN DISPOSABLE) ×1 IMPLANT
GOWN STRL REUS W/TWL LRG LVL3 (GOWN DISPOSABLE) ×1
GOWN STRL REUS W/TWL XL LVL3 (GOWN DISPOSABLE) ×1
KIT BASIN OR (CUSTOM PROCEDURE TRAY) ×2 IMPLANT
KIT ROOM TURNOVER OR (KITS) ×2 IMPLANT
LOOP VESSEL MAXI BLUE (MISCELLANEOUS) IMPLANT
MANIFOLD NEPTUNE II (INSTRUMENTS) IMPLANT
NEEDLE 22X1 1/2 (OR ONLY) (NEEDLE) IMPLANT
NS IRRIG 1000ML POUR BTL (IV SOLUTION) ×2 IMPLANT
PACK ORTHO EXTREMITY (CUSTOM PROCEDURE TRAY) ×2 IMPLANT
PAD ARMBOARD 7.5X6 YLW CONV (MISCELLANEOUS) ×4 IMPLANT
PAD CAST 3X4 CTTN HI CHSV (CAST SUPPLIES) ×1 IMPLANT
PAD CAST 4YDX4 CTTN HI CHSV (CAST SUPPLIES) ×1 IMPLANT
PADDING CAST ABS 4INX4YD NS (CAST SUPPLIES) ×1
PADDING CAST ABS COTTON 4X4 ST (CAST SUPPLIES) ×1 IMPLANT
PADDING CAST COTTON 3X4 STRL (CAST SUPPLIES) ×1
PADDING CAST COTTON 4X4 STRL (CAST SUPPLIES) ×1
PADDING CAST COTTON 6X4 STRL (CAST SUPPLIES) ×2 IMPLANT
PEG LOCKING SMOOTH 2.2X18 (Peg) ×6 IMPLANT
PEG LOCKING SMOOTH 2.2X20 (Screw) ×2 IMPLANT
PLATE NARROW DVR LEFT (Plate) ×2 IMPLANT
SCREW LOCK 12X2.7X 3 LD (Screw) ×1 IMPLANT
SCREW LOCK 14X2.7X 3 LD TPR (Screw) ×2 IMPLANT
SCREW LOCKING 2.7X12MM (Screw) ×1 IMPLANT
SCREW LOCKING 2.7X13MM (Screw) ×2 IMPLANT
SCREW LOCKING 2.7X14 (Screw) ×2 IMPLANT
SCREW MULTI DIRECTIONAL 2.7X16 (Screw) ×2 IMPLANT
SCREW MULTI DIRECTIONAL 2.7X18 (Screw) ×2 IMPLANT
SPLINT FIBERGLASS 4X30 (CAST SUPPLIES) ×2 IMPLANT
SPONGE GAUZE 4X4 12PLY STER LF (GAUZE/BANDAGES/DRESSINGS) ×2 IMPLANT
SPONGE LAP 4X18 X RAY DECT (DISPOSABLE) ×2 IMPLANT
SUT ETHILON 4 0 P 3 18 (SUTURE) ×6 IMPLANT
SUT MNCRL AB 4-0 PS2 18 (SUTURE) IMPLANT
SUT PROLENE 3 0 PS 2 (SUTURE) IMPLANT
SUT PROLENE 4 0 PS 2 18 (SUTURE) ×6 IMPLANT
SUT VIC AB 3-0 FS2 27 (SUTURE) ×4 IMPLANT
SYR CONTROL 10ML LL (SYRINGE) IMPLANT
SYSTEM CHEST DRAIN TLS 7FR (DRAIN) ×2 IMPLANT
TOWEL OR 17X24 6PK STRL BLUE (TOWEL DISPOSABLE) ×2 IMPLANT
TOWEL OR 17X26 10 PK STRL BLUE (TOWEL DISPOSABLE) ×2 IMPLANT
TUBE CONNECTING 12X1/4 (SUCTIONS) ×2 IMPLANT
TUBE EVACUATION TLS (MISCELLANEOUS) ×2 IMPLANT
WATER STERILE IRR 1000ML POUR (IV SOLUTION) ×2 IMPLANT

## 2015-08-14 NOTE — Anesthesia Preprocedure Evaluation (Addendum)
Anesthesia Evaluation  Patient identified by MRN, date of birth, ID band Patient awake    Reviewed: Allergy & Precautions, H&P , Patient's Chart, lab work & pertinent test results  History of Anesthesia Complications (+) PONV and history of anesthetic complications  Airway Mallampati: III  TM Distance: >3 FB Neck ROM: full    Dental  (+) Missing, Poor Dentition, Dental Advisory Given   Pulmonary neg pulmonary ROS,    Pulmonary exam normal breath sounds clear to auscultation       Cardiovascular hypertension, Normal cardiovascular exam Rhythm:regular Rate:Normal     Neuro/Psych  Neuromuscular disease    GI/Hepatic negative GI ROS, Neg liver ROS,   Endo/Other  negative endocrine ROS  Renal/GU negative Renal ROS     Musculoskeletal   Abdominal   Peds  Hematology negative hematology ROS (+)   Anesthesia Other Findings Rosacea, sciatica, vertigo  Previously has had what sounds like recurrent laryngeal nerve trauma/compromise following thyroid surgery, will use smaller ETT  Reproductive/Obstetrics negative OB ROS                           Anesthesia Physical Anesthesia Plan  ASA: III and emergent  Anesthesia Plan: General   Post-op Pain Management:    Induction: Intravenous and Rapid sequence  Airway Management Planned: Oral ETT  Additional Equipment: None  Intra-op Plan:   Post-operative Plan: Extubation in OR  Informed Consent: I have reviewed the patients History and Physical, chart, labs and discussed the procedure including the risks, benefits and alternatives for the proposed anesthesia with the patient or authorized representative who has indicated his/her understanding and acceptance.   Dental Advisory Given and Dental advisory given  Plan Discussed with: Anesthesiologist, CRNA and Surgeon  Anesthesia Plan Comments: (Will avoid supraclavicular block given concern for  compartment syndrome)      Anesthesia Quick Evaluation

## 2015-08-14 NOTE — ED Notes (Signed)
PT states she tripped while trying to go in her house.  Pt was sent here from [facility] b/c they did not have an X-ray machine.  Pt now experiencing L wrist pain that radiates up to her elbow and she has the sensation that her arm is in ice water (though her arm does not feel cold to touch).  Deformity noted to L wrist.

## 2015-08-14 NOTE — Anesthesia Procedure Notes (Signed)
Procedure Name: Intubation Date/Time: 08/14/2015 11:30 PM Performed by: Brien MatesMAHONY, Cesar Alf D Pre-anesthesia Checklist: Patient identified, Emergency Drugs available, Suction available, Patient being monitored and Timeout performed Patient Re-evaluated:Patient Re-evaluated prior to inductionOxygen Delivery Method: Circle system utilized Preoxygenation: Pre-oxygenation with 100% oxygen Intubation Type: IV induction Laryngoscope Size: Glidescope (Elective Glidescope) Grade View: Grade I Tube type: Oral Tube size: 6.5 mm Number of attempts: 1 Airway Equipment and Method: Stylet and Video-laryngoscopy Placement Confirmation: ETT inserted through vocal cords under direct vision,  positive ETCO2 and breath sounds checked- equal and bilateral Secured at: 21 cm Tube secured with: Tape Dental Injury: Teeth and Oropharynx as per pre-operative assessment

## 2015-08-14 NOTE — H&P (Signed)
Pamela Braun is an 81 y.o. female.   Chief Complaint: Status post fall today with evolving compartment syndrome and left wrist fracture HPI: Patient presents status post fall with evolving carpal tunnel syndrome, compartment syndrome, and a noted wrist fracture left upper extremity. She is in severe pain. She is riding in her bed and complains of uncontrollable pain as well as numbness in her fingers.  She denies prior history of injury. She's had other surgeries performed at [facility] in [place].  She denies neck back chest or abdominal pain. She notes no lower extremity pain.  I was asked to see her by the emergency room department due to the fact that she has what appears to be a compartment syndrome and I would concur.  I discussed these issues with she and her daughter  Past Medical History  Diagnosis Date  . Hypertension   . Vertigo   . Rosacea   . Thyroid disease   . Sciatica     Past Surgical History  Procedure Laterality Date  . Thyroidectomy    . Shoulder surgery      Bilateral  . Knee surgery      Right x 2  . Tonsillectomy      History reviewed. No pertinent family history. Social History:  reports that she has never smoked. She does not have any smokeless tobacco history on file. She reports that she does not drink alcohol or use illicit drugs.  Allergies:  Allergies  Allergen Reactions  . Anesthetics, Amide Nausea And Vomiting  . Celecoxib Rash  . Codeine Nausea And Vomiting  . Ibuprofen Swelling  . Meperidine Nausea And Vomiting  . Naproxen Swelling  . Sulfamethoxazole-Trimethoprim Rash  . Naloxone Nausea And Vomiting  . Pentazocine Nausea And Vomiting  . Azithromycin Photosensitivity     (Not in a hospital admission)  Results for orders placed or performed during the hospital encounter of 08/14/15 (from the past 48 hour(s))  CBC with Differential     Status: None   Collection Time: 08/14/15  9:36 PM  Result Value Ref Range   WBC  10.2 4.0 - 10.5 K/uL   RBC 4.54 3.87 - 5.11 MIL/uL   Hemoglobin 12.1 12.0 - 15.0 g/dL   HCT 37.2 36.0 - 46.0 %   MCV 81.9 78.0 - 100.0 fL   MCH 26.7 26.0 - 34.0 pg   MCHC 32.5 30.0 - 36.0 g/dL   RDW 14.6 11.5 - 15.5 %   Platelets 156 150 - 400 K/uL   Neutrophils Relative % 76 %   Neutro Abs 7.7 1.7 - 7.7 K/uL   Lymphocytes Relative 14 %   Lymphs Abs 1.4 0.7 - 4.0 K/uL   Monocytes Relative 9 %   Monocytes Absolute 0.9 0.1 - 1.0 K/uL   Eosinophils Relative 1 %   Eosinophils Absolute 0.1 0.0 - 0.7 K/uL   Basophils Relative 0 %   Basophils Absolute 0.0 0.0 - 0.1 K/uL  Basic metabolic panel     Status: Abnormal   Collection Time: 08/14/15  9:36 PM  Result Value Ref Range   Sodium 139 135 - 145 mmol/L   Potassium 4.3 3.5 - 5.1 mmol/L   Chloride 103 101 - 111 mmol/L   CO2 23 22 - 32 mmol/L   Glucose, Bld 136 (H) 65 - 99 mg/dL   BUN 25 (H) 6 - 20 mg/dL   Creatinine, Ser 0.86 0.44 - 1.00 mg/dL   Calcium 9.5 8.9 - 10.3 mg/dL  GFR calc non Af Amer 59 (L) >60 mL/min   GFR calc Af Amer >60 >60 mL/min    Comment: (NOTE) The eGFR has been calculated using the CKD EPI equation. This calculation has not been validated in all clinical situations. eGFR's persistently <60 mL/min signify possible Chronic Kidney Disease.    Anion gap 13 5 - 15   Dg Forearm Left  08/14/2015  CLINICAL DATA:  81 year old female with wrist pain EXAM: LEFT FOREARM - 2 VIEW COMPARISON:  None. FINDINGS: There is fracture of the distal radius with extension into the articular surface of the wrist. Evaluation of the bones study fracture is limited due to osteopenia. No other acute fracture identified. The soft tissues are grossly unremarkable. IMPRESSION: Interarticular fracture of the distal radius. Electronically Signed   By: Anner Crete M.D.   On: 08/14/2015 20:29    Review of Systems  Respiratory: Negative.   Gastrointestinal: Negative.   Genitourinary: Negative.   Neurological: Negative.    Psychiatric/Behavioral: Negative.     Blood pressure 100/80, pulse 70, temperature 97.6 F (36.4 C), temperature source Oral, resp. rate 17, height '5\' 3"'  (1.6 m), weight 78.019 kg (172 lb), SpO2 100 %. Physical Exam  Left wrist fracture with swelling. Chest height forearm and carpal tunnel region. She has numbness in the carpal tunnel distribution (median nerve distribution) she's. She has no evidence of infection or dystrophy at present time.  Her elbow and upper arm are nontender. Her right arm is stable with IV access.  She and I discussed these issues at length.  She has a history of middle finger triggering.  I reviewed this with her at length.  Chest is clear to auscultation.  Heart is regular rate.  Abdomen is obese nontender.  Lower extremity examination is benign today.  Patient has no evidence of laceration on open fracture but obviously has a comminuted intra-articular distal radius fracture.     Assessment/Plan Status post fall with left wrist fracture and acute compartment syndrome with carpal tunnel syndrome complaints. We will plan for fasciotomy carpal tunnel release and fixation given the evolving compression phenomenon and severe pain. I discussed with patient her findings and the relevant issues.  I feel that waiting any longer will be a detriment to her nerve and its environment. I discussed with the patient these issues and she concurs to proceed.  I've also discussed with her that I would recommend close observation after the operation and would admit her to the hospital given her allergies and other medical challenges.  She has a long history of nausea after surgery and we'll try to mitigate this is best as we can.  I discussed with her risk and benefits of incomplete nerve recovery loss of motion loss of sensation permanently and other issues as they are germane her predicament.  With all issues in mind we'll proceed accordingly. She and her daughters  concur with the plan  We are planning surgery for your upper extremity. The risk and benefits of surgery to include risk of bleeding, infection, anesthesia,  damage to normal structures and failure of the surgery to accomplish its intended goals of relieving symptoms and restoring function have been discussed in detail. With this in mind we plan to proceed. I have specifically discussed with the patient the pre-and postoperative regime and the dos and don'ts and risk and benefits in great detail. Risk and benefits of surgery also include risk of dystrophy(CRPS), chronic nerve pain, failure of the healing process to go  onto completion and other inherent risks of surgery The relavent the pathophysiology of the disease/injury process, as well as the alternatives for treatment and postoperative course of action has been discussed in great detail with the patient who desires to proceed.  We will do everything in our power to help you (the patient) restore function to the upper extremity. It is a pleasure to see this patient today.  Paulene Floor 08/14/2015, 10:19 PM

## 2015-08-14 NOTE — ED Provider Notes (Signed)
CSN: [id number]     Arrival date & time 08/14/15  1828 History   First MD Initiated Contact with Patient 08/14/15 2031     Chief Complaint  Patient presents with  . Fall    arm pain     (Consider location/radiation/quality/duration/timing/severity/associated sxs/prior Treatment) HPI Comments: Pamela Braun is a 81 y.o. female with a PMHx of HTN, vertigo, rosacea, hypothyroidism, and sciatica, who presents to the ED with complaints of mechanical fall when she tripped coming in her door around 11 AM, landing on her left wrist. She reports 10/10 constant sharp left wrist pain radiating into her hand worse with movement and mildly improved with Tylenol and ice. Associated symptoms include bruising and swelling to her left wrist and numbness/tingling in the left hand fingertips. She denies any abrasions, head injury or loss of consciousness, presyncopal feeling, recent fevers or chills, chest pain, shortness breath, abdominal pain, nausea, vomiting, diarrhea, constipation, dysuria, hematuria, focal weakness, or other injuries sustained during the accident. She has not had anything to eat since 10:30 this morning.  Patient is a 81 y.o. female presenting with fall and wrist pain. The history is provided by the patient. No language interpreter was used.  Fall Associated symptoms include arthralgias (L wrist), joint swelling and numbness. Pertinent negatives include no abdominal pain, chest pain, chills, fever, myalgias, nausea, urinary symptoms, vomiting or weakness.  Wrist Pain This is a new problem. The current episode started today. The problem occurs constantly. The problem has been unchanged. Associated symptoms include arthralgias (L wrist), joint swelling and numbness. Pertinent negatives include no abdominal pain, chest pain, chills, fever, myalgias, nausea, urinary symptoms, vomiting or weakness. Exacerbated by: movement of wrist. She has tried acetaminophen and ice for the symptoms. The treatment  provided mild relief.    Past Medical History  Diagnosis Date  . Hypertension   . Vertigo   . Rosacea   . Thyroid disease   . Sciatica    Past Surgical History  Procedure Laterality Date  . Thyroidectomy    . Shoulder surgery      Bilateral  . Knee surgery      Right x 2  . Tonsillectomy     No family history on file. Social History  Substance Use Topics  . Smoking status: Never Smoker   . Smokeless tobacco: None  . Alcohol Use: No   OB History    No data available     Review of Systems  Constitutional: Negative for fever and chills.  HENT: Negative for facial swelling (no head injury).   Respiratory: Negative for shortness of breath.   Cardiovascular: Negative for chest pain.  Gastrointestinal: Negative for nausea, vomiting, abdominal pain, diarrhea and constipation.  Genitourinary: Negative for dysuria and hematuria.  Musculoskeletal: Positive for joint swelling and arthralgias (L wrist). Negative for myalgias.  Skin: Positive for color change (bruising). Negative for wound.  Allergic/Immunologic: Negative for immunocompromised state.  Neurological: Positive for numbness. Negative for syncope, weakness and light-headedness.       +numbness/tingling L hand fingertips  Psychiatric/Behavioral: Negative for confusion.   10 Systems reviewed and are negative for acute change except as noted in the HPI.    Allergies  Anesthetics, amide; Celecoxib; Codeine; Ibuprofen; Meperidine; Naproxen; Sulfamethoxazole-trimethoprim; Naloxone; Pentazocine; and Azithromycin  Home Medications   Prior to Admission medications   Medication Sig Start Date End Date Taking? Authorizing Provider  acetaminophen (TYLENOL) 500 MG tablet Take 500 mg by mouth.    Historical Provider, MD  aspirin 81  MG chewable tablet Chew 81 mg by mouth.    Historical Provider, MD  Calcium-Vitamin D-Vitamin K 500-500-40 MG-UNT-MCG CHEW Chew 500 mg by mouth.    Historical Provider, MD  diazepam (VALIUM) 2 MG  tablet Take 2 mg by mouth as needed. 06/29/14   Historical Provider, MD  diclofenac sodium (VOLTAREN) 1 % GEL Place onto the skin as needed. 08/25/13   Historical Provider, MD  levothyroxine (SYNTHROID, LEVOTHROID) 137 MCG tablet Take 137 mcg by mouth. 02/16/14   Historical Provider, MD  metoprolol (LOPRESSOR) 50 MG tablet Take 50 mg by mouth. 11/10/13   Historical Provider, MD  Multiple Vitamins-Minerals (VISION-VITE PRESERVE PO) Take by mouth.    Historical Provider, MD  promethazine (PHENERGAN) 25 MG tablet Take 25 mg by mouth. 06/29/14   Historical Provider, MD  ranitidine (ZANTAC) 150 MG tablet Take 150 mg by mouth.    Historical Provider, MD   BP 100/80 mmHg  Pulse 70  Temp(Src) 97.6 F (36.4 C) (Oral)  Resp 17  Ht  (1.6 m)  Wt 78.019 kg  BMI 30.48 kg/m2  SpO2 100% Physical Exam  Constitutional: She is oriented to person, place, and time. Vital signs are normal. She appears well-developed and well-nourished.  Non-toxic appearance. No distress.  Afebrile, nontoxic, NAD  HENT:  Head: Normocephalic and atraumatic.  Mouth/Throat: Oropharynx is clear and moist and mucous membranes are normal.  Eyes: Conjunctivae and EOM are normal. Right eye exhibits no discharge. Left eye exhibits no discharge.  Neck: Normal range of motion. Neck supple.  Cardiovascular: Normal rate, regular rhythm, normal heart sounds and intact distal pulses.  Exam reveals no gallop and no friction rub.   No murmur heard. Pulmonary/Chest: Effort normal and breath sounds normal. No respiratory distress. She has no decreased breath sounds. She has no wheezes. She has no rhonchi. She has no rales.  Abdominal: Soft. Normal appearance and bowel sounds are normal. She exhibits no distension. There is no tenderness. There is no rigidity, no rebound and no guarding.  Musculoskeletal:       Left wrist: She exhibits decreased range of motion (due to pain), tenderness, bony tenderness, swelling and deformity. She exhibits no  laceration.  L wrist with diminished ROM due to pain, +deformity, +bruising, +swelling, with TTP to wrist along radial aspect, no skin injury/openings. Grip strength intact. Sensation diminished to palmar aspect of distal fingertips on digits #2-3, otherwise sensation intact in remaining aspects of hand. Cap refill brisk and present.  No tenderness in pelvis, RUE, or BLEs. No evidence of injury in all other extremities.  Neurological: She is alert and oriented to person, place, and time. She has normal strength. No sensory deficit.  Skin: Skin is warm, dry and intact. No rash noted.  Psychiatric: She has a normal mood and affect.  Nursing note and vitals reviewed.   ED Course  Procedures (including critical care time) Labs Review Labs Reviewed  BASIC METABOLIC PANEL - Abnormal; Notable for the following:    Glucose, Bld 136 (*)    BUN 25 (*)    GFR calc non Af Amer 59 (*)    All other components within normal limits  CBC WITH DIFFERENTIAL/PLATELET    Imaging Review Dg Forearm Left  08/14/2015  CLINICAL DATA:  81 year old female with wrist pain EXAM: LEFT FOREARM - 2 VIEW COMPARISON:  None. FINDINGS: There is fracture of the distal radius with extension into the articular surface of the wrist. Evaluation of the bones study fracture is limited due  to osteopenia. No other acute fracture identified. The soft tissues are grossly unremarkable. IMPRESSION: Interarticular fracture of the distal radius. Electronically Signed   By: Elgie CollardArash  Radparvar M.D.   On: 08/14/2015 20:29   I have personally reviewed and evaluated these images and lab results as part of my medical decision-making.   EKG Interpretation None      MDM   Final diagnoses:  Distal radial fracture, left, closed, initial encounter  Left hand paresthesia    81 y.o.  female here with mechanical fall and L wrist pain, some paresthesias to distal tips of fingers, distal pulses intact with adequate cap refill, sensation slightly  diminished to distal fingertips on digits 2-3. +Bruising, +swelling, +deformity. No other injuries noted. Xray reveals intraarticular distal radius fx. Given that sensation is diminished distal to injury and fx is intraarticular, will consult surgery. Will get basic labs and EKG in preparation for possible operative requirement. Will give pain meds. Will reassess shortly.   9:23 PM Dr. Amanda PeaGramig returning page, will come by and see pt and evaluate for possible need for surgery/further management.   10:07 PM Dr. Amanda PeaGramig here to see pt, would like to take her to the OR tonight due to concerns for possible compartment syndrome. Labs unremarkable. EKG not yet done but will ensure this is performed ASAP. Please see Dr. Carlos LeveringGramig's note for further documentation of care.  BP 100/80 mmHg  Pulse 70  Temp(Src) 97.6 F (36.4 C) (Oral)  Resp 17  Ht 5\' 3"  (1.6 m)  Wt 78.019 kg  BMI 30.48 kg/m2  SpO2 100%  Meds ordered this encounter  Medications  . HYDROmorphone (DILAUDID) injection 0.5 mg    Sig:      Kapil Petropoulos Camprubi-Soms, PA-C 08/14/15 2209  Derwood KaplanAnkit Nanavati, MD 08/15/15 1513

## 2015-08-15 DIAGNOSIS — S52502A Unspecified fracture of the lower end of left radius, initial encounter for closed fracture: Secondary | ICD-10-CM | POA: Diagnosis present

## 2015-08-15 DIAGNOSIS — M1712 Unilateral primary osteoarthritis, left knee: Secondary | ICD-10-CM | POA: Diagnosis not present

## 2015-08-15 DIAGNOSIS — E669 Obesity, unspecified: Secondary | ICD-10-CM | POA: Diagnosis not present

## 2015-08-15 DIAGNOSIS — M179 Osteoarthritis of knee, unspecified: Secondary | ICD-10-CM | POA: Diagnosis not present

## 2015-08-15 DIAGNOSIS — K219 Gastro-esophageal reflux disease without esophagitis: Secondary | ICD-10-CM | POA: Diagnosis not present

## 2015-08-15 DIAGNOSIS — Z6830 Body mass index (BMI) 30.0-30.9, adult: Secondary | ICD-10-CM | POA: Diagnosis not present

## 2015-08-15 DIAGNOSIS — S52572A Other intraarticular fracture of lower end of left radius, initial encounter for closed fracture: Secondary | ICD-10-CM | POA: Diagnosis not present

## 2015-08-15 DIAGNOSIS — Z7982 Long term (current) use of aspirin: Secondary | ICD-10-CM | POA: Diagnosis not present

## 2015-08-15 DIAGNOSIS — G5602 Carpal tunnel syndrome, left upper limb: Secondary | ICD-10-CM | POA: Diagnosis not present

## 2015-08-15 DIAGNOSIS — E039 Hypothyroidism, unspecified: Secondary | ICD-10-CM | POA: Diagnosis not present

## 2015-08-15 DIAGNOSIS — I10 Essential (primary) hypertension: Secondary | ICD-10-CM | POA: Diagnosis not present

## 2015-08-15 DIAGNOSIS — W010XXA Fall on same level from slipping, tripping and stumbling without subsequent striking against object, initial encounter: Secondary | ICD-10-CM | POA: Diagnosis not present

## 2015-08-15 DIAGNOSIS — Z79899 Other long term (current) drug therapy: Secondary | ICD-10-CM | POA: Diagnosis not present

## 2015-08-15 DIAGNOSIS — T79A12A Traumatic compartment syndrome of left upper extremity, initial encounter: Secondary | ICD-10-CM | POA: Diagnosis not present

## 2015-08-15 DIAGNOSIS — M25532 Pain in left wrist: Secondary | ICD-10-CM | POA: Diagnosis present

## 2015-08-15 DIAGNOSIS — Y92009 Unspecified place in unspecified non-institutional (private) residence as the place of occurrence of the external cause: Secondary | ICD-10-CM | POA: Diagnosis not present

## 2015-08-15 MED ORDER — ONDANSETRON HCL 4 MG PO TABS: 4.0000 mg | ORAL_TABLET | Freq: Four times a day (QID) | ORAL | Status: DC | PRN

## 2015-08-15 MED ORDER — METOPROLOL TARTRATE 50 MG PO TABS
50.0000 mg | ORAL_TABLET | Freq: Every morning | ORAL | Status: DC
  Administered 2015-08-15 – 2015-08-18 (×4): 50 mg via ORAL
  Filled 2015-08-15 (×4): qty 1

## 2015-08-15 MED ORDER — SODIUM BICARBONATE 8.4 % IV SOLN
INTRAVENOUS | Status: AC
  Filled 2015-08-15: qty 50

## 2015-08-15 MED ORDER — CEFAZOLIN SODIUM 1-5 GM-% IV SOLN
1.0000 g | Freq: Three times a day (TID) | INTRAVENOUS | Status: DC
  Administered 2015-08-15 – 2015-08-18 (×10): 1 g via INTRAVENOUS
  Filled 2015-08-15 (×12): qty 50

## 2015-08-15 MED ORDER — METHOCARBAMOL 1000 MG/10ML IJ SOLN
500.0000 mg | Freq: Four times a day (QID) | INTRAVENOUS | Status: DC | PRN
  Filled 2015-08-15: qty 5

## 2015-08-15 MED ORDER — ALPRAZOLAM 0.5 MG PO TABS: 0.5000 mg | ORAL_TABLET | Freq: Four times a day (QID) | ORAL | Status: DC | PRN

## 2015-08-15 MED ORDER — DOCUSATE SODIUM 100 MG PO CAPS
100.0000 mg | ORAL_CAPSULE | Freq: Two times a day (BID) | ORAL | Status: DC
  Administered 2015-08-15 – 2015-08-18 (×7): 100 mg via ORAL
  Filled 2015-08-15 (×7): qty 1

## 2015-08-15 MED ORDER — LACTATED RINGERS IV SOLN
INTRAVENOUS | Status: DC
  Administered 2015-08-15: 02:00:00 via INTRAVENOUS

## 2015-08-15 MED ORDER — ALBUTEROL SULFATE HFA 108 (90 BASE) MCG/ACT IN AERS
INHALATION_SPRAY | RESPIRATORY_TRACT | Status: AC
  Filled 2015-08-15: qty 6.7

## 2015-08-15 MED ORDER — CEFAZOLIN SODIUM 1-5 GM-% IV SOLN
1.0000 g | INTRAVENOUS | Status: AC
  Administered 2015-08-15: 1 g via INTRAVENOUS

## 2015-08-15 MED ORDER — HYDROMORPHONE HCL 1 MG/ML IJ SOLN
INTRAMUSCULAR | Status: AC
  Filled 2015-08-15: qty 1

## 2015-08-15 MED ORDER — TAPENTADOL HCL 50 MG PO TABS
100.0000 mg | ORAL_TABLET | ORAL | Status: DC | PRN
  Administered 2015-08-15 – 2015-08-17 (×7): 100 mg via ORAL
  Filled 2015-08-15 (×7): qty 2

## 2015-08-15 MED ORDER — ACETAMINOPHEN 500 MG PO TABS
500.0000 mg | ORAL_TABLET | Freq: Four times a day (QID) | ORAL | Status: DC | PRN
  Administered 2015-08-15 – 2015-08-17 (×2): 500 mg via ORAL
  Filled 2015-08-15 (×2): qty 1

## 2015-08-15 MED ORDER — 0.9 % SODIUM CHLORIDE (POUR BTL) OPTIME
TOPICAL | Status: DC | PRN
  Administered 2015-08-15: 1000 mL

## 2015-08-15 MED ORDER — MORPHINE SULFATE (PF) 2 MG/ML IV SOLN
1.0000 mg | INTRAVENOUS | Status: DC | PRN
  Administered 2015-08-15 – 2015-08-16 (×3): 1 mg via INTRAVENOUS
  Filled 2015-08-15 (×3): qty 1

## 2015-08-15 MED ORDER — ONDANSETRON HCL 4 MG/2ML IJ SOLN
INTRAMUSCULAR | Status: DC | PRN
  Administered 2015-08-15: 4 mg via INTRAVENOUS

## 2015-08-15 MED ORDER — CEFAZOLIN SODIUM 1-5 GM-% IV SOLN
INTRAVENOUS | Status: AC
  Filled 2015-08-15: qty 50

## 2015-08-15 MED ORDER — PROMETHAZINE HCL 25 MG RE SUPP: 12.5000 mg | Freq: Four times a day (QID) | RECTAL | Status: DC | PRN

## 2015-08-15 MED ORDER — FAMOTIDINE 20 MG PO TABS: 20.0000 mg | ORAL_TABLET | Freq: Two times a day (BID) | ORAL | Status: DC | PRN

## 2015-08-15 MED ORDER — VITAMIN C 500 MG PO TABS
1000.0000 mg | ORAL_TABLET | Freq: Every day | ORAL | Status: DC
  Administered 2015-08-15 – 2015-08-18 (×4): 1000 mg via ORAL
  Filled 2015-08-15 (×4): qty 2

## 2015-08-15 MED ORDER — LABETALOL HCL 5 MG/ML IV SOLN
INTRAVENOUS | Status: AC
  Filled 2015-08-15: qty 4

## 2015-08-15 MED ORDER — HYDROMORPHONE HCL 1 MG/ML IJ SOLN
0.2500 mg | INTRAMUSCULAR | Status: DC | PRN
  Administered 2015-08-15 (×2): 0.5 mg via INTRAVENOUS

## 2015-08-15 MED ORDER — LABETALOL HCL 5 MG/ML IV SOLN
5.0000 mg | Freq: Once | INTRAVENOUS | Status: AC
  Administered 2015-08-15: 5 mg via INTRAVENOUS

## 2015-08-15 MED ORDER — METHOCARBAMOL 500 MG PO TABS
500.0000 mg | ORAL_TABLET | Freq: Four times a day (QID) | ORAL | Status: DC | PRN
  Administered 2015-08-15 – 2015-08-16 (×2): 500 mg via ORAL
  Filled 2015-08-15 (×2): qty 1

## 2015-08-15 MED ORDER — ONDANSETRON HCL 4 MG/2ML IJ SOLN
4.0000 mg | Freq: Four times a day (QID) | INTRAMUSCULAR | Status: DC | PRN
  Administered 2015-08-15 – 2015-08-16 (×2): 4 mg via INTRAVENOUS
  Filled 2015-08-15 (×2): qty 2

## 2015-08-15 MED ORDER — ONDANSETRON HCL 4 MG/2ML IJ SOLN: 4.0000 mg | Freq: Once | INTRAMUSCULAR | Status: DC | PRN

## 2015-08-15 MED ORDER — LEVOTHYROXINE SODIUM 25 MCG PO TABS
125.0000 ug | ORAL_TABLET | Freq: Every day | ORAL | Status: DC
  Administered 2015-08-15 – 2015-08-18 (×4): 125 ug via ORAL
  Filled 2015-08-15 (×4): qty 1

## 2015-08-15 NOTE — Transfer of Care (Signed)
Immediate Anesthesia Transfer of Care Note  Patient: Pamela Braun  Procedure(s) Performed: Procedure(s): OPEN REDUCTION INTERNAL FIXATION (ORIF) WRIST FRACTURE AND COMPARTMENT SYNDROME RELEASE (Left)  Patient Location: PACU  Anesthesia Type:General  Level of Consciousness: sedated  Airway & Oxygen Therapy: Patient Spontanous Breathing and Patient connected to face mask oxygen  Post-op Assessment: Report given to RN and Post -op Vital signs reviewed and stable  Post vital signs: Reviewed and stable  Last Vitals:  Filed Vitals:   08/14/15 1845  BP: 100/80  Pulse: 70  Temp: 36.4 C  Resp: 17    Complications: No apparent anesthesia complications

## 2015-08-15 NOTE — Op Note (Signed)
See Lorretta Harpdictation#[id number] Kritika Stukes MD

## 2015-08-15 NOTE — Progress Notes (Signed)
Patient ID: Pamela Braun, female   DOB: 1926/03/06, 82 y.o.   MRN: [id number] Patient has been seen and examined. Patient has pain appropriate to his injury/process. Patient denies new complaints at this present time. I have discussed the care pathway with nursing staff. Patient is appropriate and alert.  We reviewed vital signs and intake output which are stable.  The upper extremity is neurovascularly intact. Refill is normal. There is no signs of compartment syndrome. There is no signs of dystrophy. There is normal sensation. She has pain appropriate to condition but has markedly improved in terms of her sensation and motor function. Her poor wrist pain response has certainly diminished. I this to the compartment release and carpal tunnel release. I feel her median nerve is in a better place at present time and in a much improved environment. Her drains are stable and I have thus left them in place  I have spent a  great deal of time discussing range of motion edema control and other techniques to decrease edema and promote flexion extension of the fingers. Patient understands the importance of elevation range of motion massage and other measures to lessen pain and prevent swelling.  We have also discussed immobilization to appropriate areas involved.  We have discussed with the patient shoulder range of motion to prevent adhesive capsulitis.  The remainder of the examination is normal today without complicating feature.   All questions have been incurred and answered.  I will plan for continued observatory care. Elevate move and massage fingers. We will remove her drains tomorrow. Should and problems arise she'll notify us.  She will be ambulatory in the room today and move her feet frequently.  Chest is clear, heart regular rate, abdomen is nontender and she is tolerating liquids and breakfast today   Bakary Bramer MD

## 2015-08-15 NOTE — Evaluation (Addendum)
Occupational Therapy Evaluation Patient Details Name: Pamela Braun MRN: [id number] DOB: 1926-03-19 Today's Date: 08/15/2015    History of Present Illness Pt admitted with Left radius fx after fall at home s/p ORIF and open carpal tunnel release. PMHx: HTN, vertigo, thyroid dz, bil shoulder surgeries and sciatica.   Clinical Impression   Pt s/p above. Pt independent with ADLs, PTA. Feel pt will benefit from acute OT to increase independence and address LUE prior to d/c. Recommending HHOT and 24/7 supervision/assist upon d/c.    Follow Up Recommendations  Home health OT;Supervision/Assistance - 24 hour    Equipment Recommendations  None recommended by OT    Recommendations for Other Services       Precautions / Restrictions Precautions Precautions: Fall Restrictions Weight Bearing Restrictions: Yes LUE Weight Bearing: Weight bear through elbow only      Mobility Bed Mobility Overal bed mobility: Needs Assistance Bed Mobility: Supine to Sit     Supine to sit: Supervision        Transfers Overall transfer level: Needs assistance Equipment used: Hemi-walker Transfers: Sit to/from Stand Sit to Stand: Min guard              Balance      Used Hemi-walker for ambulation. History of falls.                                      ADL Overall ADL's : Needs assistance/impaired Eating/Feeding: Set up;Sitting       Upper Body Bathing: Sitting;Moderate assistance           Lower Body Dressing: Moderate assistance;Sit to/from stand   Toilet Transfer: Minimal assistance;Ambulation (hemi walker; sit to stand from bed)   Toileting- Clothing Manipulation and Hygiene: Min guard;Sit to/from stand       Functional mobility during ADLs: Minimal assistance (hemi walker) General ADL Comments: Discussed UB dressing technique and UB clothing that may be easier to manage. Educated on edema management techniques for LUE and elevated it at end of session.  suggested using a loofah to wash under right arm. Mentioned use of AE that pt could use for LB ADLs. When asked if pt would get up by herself she reports she would (OT asking to see if she needed 24/7 supervision upon d/c)     Vision     Perception     Praxis      Pertinent Vitals/Pain Pain Assessment: 0-10 Pain Score: 7  Pain Location: LUE Pain Intervention(s): Monitored during session;Repositioned     Hand Dominance Right   Extremity/Trunk Assessment Upper Extremity Assessment Upper Extremity Assessment: RUE deficits/detail;LUE deficits/detail RUE Deficits / Details: limited AROM shoulder flexion-less than 90 degrees (history of bilateral shoulder surgeries) LUE Deficits / Details: limited AROM shoulder flexion-can raise above 90 degrees LUE Sensation: decreased light touch LUE Coordination: decreased fine motor   Lower Extremity Assessment Lower Extremity Assessment: Defer to PT evaluation       Communication Communication Communication: No difficulties   Cognition Arousal/Alertness: Awake/alert Behavior During Therapy: WFL for tasks assessed/performed Overall Cognitive Status:  (unsure of baseline-decreased safety awareness)                     General Comments       Exercises Exercises: Other exercises Other Exercises Other Exercises: moved left digits; OT demonstrated retrograde massage on pt's left digits   Shoulder Instructions  Home Living Family/patient expects to be discharged to:: Private residence Living Arrangements: Children Available Help at Discharge: Family (per daughter, trying to work out someone staying with her) Type of Home: House Home Access: Stairs to enter Secretary/administratorntrance Stairs-Number of Steps: 1   Home Layout: One level     Bathroom Shower/Tub: Chief Strategy OfficerTub/shower unit   Bathroom Toilet: Handicapped height     Home Equipment: Grab bars - tub/shower;Bedside commode;Shower seat;Adaptive equipment (access to reacher, long shoehorn,  and sockaid) Adaptive Equipment: Long-handled sponge        Prior Functioning/Environment Level of Independence: Independent with assistive device(s)        Comments: uses cane during the day and walker at night to toilet; daughter helps some with cleaning    OT Diagnosis: Acute pain   OT Problem List: Decreased strength;Decreased range of motion;Decreased activity tolerance;Pain;Impaired sensation;Impaired UE functional use;Decreased knowledge of precautions;Decreased knowledge of use of DME or AE;Decreased safety awareness;Impaired balance (sitting and/or standing);Decreased coordination   OT Treatment/Interventions: Self-care/ADL training;DME and/or AE instruction;Therapeutic activities;Patient/family education;Balance training;Therapeutic exercise    OT Goals(Current goals can be found in the care plan section) Acute Rehab OT Goals Patient Stated Goal: not stated OT Goal Formulation: With patient Time For Goal Achievement: 08/22/15 Potential to Achieve Goals: Good ADL Goals Pt Will Perform Upper Body Bathing: with set-up;with supervision;sitting;with caregiver independent in assisting (using AE if needed) Pt Will Perform Lower Body Dressing: with min guard assist;with adaptive equipment;sit to/from stand Pt Will Transfer to Toilet: with supervision;ambulating;with set-up Additional ADL Goal #1: Pt will independently verbalize 3 edema management techniques for LUE.  OT Frequency: Min 2X/week   Barriers to D/C:            Co-evaluation              End of Session Equipment Utilized During Treatment: Gait belt;Other (comment) (hem-walker) Nurse Communication: Other (comment);Mobility status (ice and IV beeping)  Activity Tolerance: Patient tolerated treatment well Patient left: in chair;with call bell/phone within reach;with family/visitor present   Time: 1450-1516 OT Time Calculation (min): 26 min Charges:  OT General Charges $OT Visit: 1 Procedure OT  Evaluation $Initial OT Evaluation Tier I: 1 Procedure G-Codes: OT G-codes **NOT FOR INPATIENT CLASS** Functional Assessment Tool Used: clinical judgment Functional Limitation: Self care Self Care Current Status (Z6109(G8987): At least 20 percent but less than 40 percent impaired, limited or restricted Self Care Goal Status (U0454(G8988): At least 1 percent but less than 20 percent impaired, limited or restricted  Earlie RavelingStraub, Loveta Dellis L OTR/L [phone] 08/15/2015, 3:44 PM

## 2015-08-15 NOTE — Op Note (Signed)
NAMDannielle Karvonen:  Enns, Lexy                ACCOUNT NO.:  [id number]  MEDICAL RECORD NO.:  [id number]  LOCATION:  [id number]                        FACILITY:  [facility]  PHYSICIAN:  Dionne AnoWilliam M. Keevin Panebianco, M.D.DATE OF BIRTH:  [birth year]  DATE OF PROCEDURE: DATE OF DISCHARGE:  08/14/2015                              OPERATIVE REPORT   PREOPERATIVE DIAGNOSES:  Left distal radius fracture with acute compartment syndrome and acute carpal tunnel syndrome.  POSTOPERATIVE DIAGNOSES:  Left distal radius fracture with acute compartment syndrome and acute carpal tunnel syndrome.  PROCEDURES: 1. Open reduction and internal fixation, greater than 3-part fracture,     left distal radius with Biomet plate and screw construct. 2. Fasciotomy, volar deep and superficial compartments, left forearm. 3. Open carpal tunnel release, left hand and wrist. 4. AP, lateral and oblique x-rays were performed, examined and     interpreted by myself, left upper extremity.  SURGEON:  Dionne AnoWilliam M. Amanda PeaGramig, M.D.  ASSISTANT:  None.  COMPLICATIONS:  None.  ANESTHESIA:  General.  TOURNIQUET TIME:  Less than an hour.  INDICATIONS:  This is an 82 year old female who fell today around noon. She had progressive pain and problems and ultimately presented to the emergency room.  In the emergency room, x-rays revealed the greater than 3-part fracture and the concern was that she was evolving into a compartment syndrome phenomenon.  She had loss of sensation in her hand and exquisite pain.  When I saw this patient, she was writhing in pain, which was uncontrollable.  Due to this, I recommended surgical decompression of the carpal tunnel and forearm compartments as well as stabilization of the bony fracture.  With this in mind, she desires to proceed.  I discussed this with her family and went over the do's and don'ts, the relevant pathophysiology and various imponderables in regard to this situation.  The patient is not on blood thinners,  but takes an aspirin a day.  OPERATION IN DETAIL:  The patient was seen by myself and Anesthesia, taken to the operative theater, underwent a smooth induction of general anesthesia, she was given preoperative Ancef in the form of 2 g IV.  The patient had a pre-scrub with Hibiclens followed by 10 minutes surgical Betadine scrub and paint performed by myself.  I was very careful with handling her soft tissue and skin.  Following this, the patient was then underwent a very careful and cautious approach to the extremity with final time-out being called and sterile field being applied.  The operation commenced with curvilinear incision radially, this swiped across the wrist and down into the carpal canal region.  Skin flap was elevated.  I kept a subcutaneous bridge of tissue overlying the distal wrist crease and identified the palmar cutaneous branch of the median nerve so as not to injure it.  I immediately encountered the large amount of hematoma fluid in the carpal canal, this egressed.  I performed a large decompression of this fluid, which was obviously against the nerve, it appeared.  I felt this was the inciting force for the patient's horrific pain/nerve pain as well as of course the fracture being contributory in terms of the pain.  The hematoma was decompressed.  Following this, I performed a superficial and deep fasciotomy of the forearm and then set course to release the carpal tunnel.  The carpal tunnel was released under direct vision.  Fat pad egressed nicely and we thoroughly decompressed the median nerve.  The palmar cutaneous branch of the median nerve was identified and kept out of harm's way.  The patient tolerated this well.  This was a full carpal tunnel release and a full fasciotomy of the deep and superficial forearm.  Following this, I then very carefully swept the carpal canal contents ulnarly, carefully protected the radial artery and incised the pronator. A  narrow DVR plate and screw of standard length was applied to fixate the fracture.  I did not want the fracture to have anymore movement as I felt that the fracture hematoma and trauma was certainly the culprit in terms of her acute carpal tunnel syndrome and compartmental phenomenon.  The radial height inclination and volar tilt was obtained without difficulty.  Standard AO technique was used and there were no complicating features.  AP, lateral, and oblique x-rays were performed, examined, and interpreted by myself and looked to be excellent.  This was an uncomplicated plate placement.  Following this, I repaired the pronator with Vicryl, which the patient tolerated well.  Once this was completed, I placed two drains, a size 7 TLS in the carpal tunnel and a size 10 TLS more proximally.  The wound was closed after period of observatory care with tourniquet down and allowing circulatory restoration.  The patient had nice, warm and pink hand with excellent refill.  There were no complicating features.  The patient had some general oozing in the form of bloody aggression. The viscosity of her blood was rather thin and likely owing to aspirin use. Ultimately, the patient was observed and hemostasis was appeared to be adequate; however, given the condition, I certainly want to monitor this.  Thus, the drains were hooked up to suction and I watched these for a little while to make sure that there was no excessive bleeding.  I did not see any obvious arterial or venous bleeds on thorough inspection.  I should note that we irrigated with greater than 3 liters of saline and ultimately after the wound was closed, we dressed with Adaptic, Xeroform, gauze, and a Kerlix as well as a sterile bandage. The patient tolerated this well.  There were no complicating features.  The patient was going to be monitored closely in the recovery room.  I will watch her and ensure that we do not have any  problems.  I have discussed with the family and I want to look out for any postop hematoma, keep the drains in, make sure that we obtained therapy consults to make sure she is steady on her gait as this was a fall on an 82 year old.  We went over do's and don'ts, various imponderables.  All questions have been encouraged and answered.  It was a pleasure to see her today and participate in her care.  Should any problems arise, we will be immediately available.     Dionne Ano. Amanda Pea, M.D.     [facility]  D:  08/15/2015  T:  08/15/2015  Job:  [id number]

## 2015-08-15 NOTE — Anesthesia Postprocedure Evaluation (Signed)
Anesthesia Post Note  Patient: Dannielle KarvonenMaxine Toledo  Procedure(s) Performed: Procedure(s) (LRB): OPEN REDUCTION INTERNAL FIXATION (ORIF) WRIST FRACTURE AND COMPARTMENT SYNDROME RELEASE (Left)  Patient location during evaluation: PACU Anesthesia Type: General Level of consciousness: awake and alert Pain management: pain level controlled Vital Signs Assessment: post-procedure vital signs reviewed and stable Respiratory status: spontaneous breathing, nonlabored ventilation, respiratory function stable and patient connected to nasal cannula oxygen Cardiovascular status: blood pressure returned to baseline and stable Postop Assessment: no signs of nausea or vomiting Anesthetic complications: no    Last Vitals:  Filed Vitals:   08/15/15 0200 08/15/15 0215  BP: 183/79 175/72  Pulse: 79 71  Temp:    Resp: 16 24    Last Pain:  Filed Vitals:   08/15/15 0216  PainSc: 4                  Reino KentJudd, Camron Monday J

## 2015-08-15 NOTE — Progress Notes (Signed)
D; Report given to RN to change drain tube Q 4hrs.

## 2015-08-15 NOTE — Evaluation (Signed)
Physical Therapy Evaluation Patient Details Name: Pamela KarvonenMaxine Braun MRN: [id number] DOB: 06-02-1926 Today's Date: 08/15/2015   History of Present Illness  Pt admitted with Left radius fx after fall at home s/p ORIF. PMHx: HTN, vertigo, thyroid dz, bil shoulder sx  Clinical Impression  Pt very pleasant and moving well. Pt limited by nausea and emesis this session with RN present and giving medication. Pt with decreased mobility and balance who will benefit from acute therapy for gait training and to increase mobility for decreased burden of care. O2 sats 94% on RA with HR89    Follow Up Recommendations Home health PT    Equipment Recommendations  Other (comment) (hemiwalker)    Recommendations for Other Services OT consult     Precautions / Restrictions Precautions Precautions: Fall Restrictions Weight Bearing Restrictions: Yes LUE Weight Bearing: Non weight bearing      Mobility  Bed Mobility Overal bed mobility: Modified Independent             General bed mobility comments: cues for sequence with use of rail and exiting bed to right  Transfers Overall transfer level: Needs assistance   Transfers: Sit to/from Stand Sit to Stand: Min guard         General transfer comment: cues for hand placement and safety  Ambulation/Gait Ambulation/Gait assistance: Min guard Ambulation Distance (Feet): 8 Feet Assistive device: Hemi-walker Gait Pattern/deviations: Step-through pattern;Decreased stride length   Gait velocity interpretation: Below normal speed for age/gender General Gait Details: cues for posture and hemiwalker use. pt nauseated and vomited with gait limiting distance and mobility today  Stairs            Wheelchair Mobility    Modified Rankin (Stroke Patients Only)       Balance Overall balance assessment: Needs assistance   Sitting balance-Leahy Scale: Good       Standing balance-Leahy Scale: Fair                                Pertinent Vitals/Pain Pain Assessment: 0-10 Pain Score: 3  Pain Location: LUE Pain Descriptors / Indicators: Aching Pain Intervention(s): Limited activity within patient's tolerance;Monitored during session;Repositioned;Premedicated before session    Home Living Family/patient expects to be discharged to:: Private residence Living Arrangements: Children Available Help at Discharge: Family;Available PRN/intermittently Type of Home: House Home Access: Stairs to enter   Entrance Stairs-Number of Steps: 1 Home Layout: One level Home Equipment: Walker - 2 wheels;Cane - single point;Bedside commode      Prior Function Level of Independence: Independent with assistive device(s)         Comments: uses cane during the day and walker at night to toilet     Hand Dominance        Extremity/Trunk Assessment   Upper Extremity Assessment: Defer to OT evaluation           Lower Extremity Assessment: Generalized weakness      Cervical / Trunk Assessment: Kyphotic  Communication   Communication: No difficulties  Cognition Arousal/Alertness: Awake/alert Behavior During Therapy: WFL for tasks assessed/performed Overall Cognitive Status: Within Functional Limits for tasks assessed                      General Comments      Exercises        Assessment/Plan    PT Assessment Patient needs continued PT services  PT Diagnosis Difficulty walking   PT Problem  List Decreased activity tolerance;Decreased strength;Decreased balance;Pain;Decreased knowledge of use of DME  PT Treatment Interventions Gait training;Stair training;Functional mobility training;Therapeutic activities;Patient/family education;DME instruction   PT Goals (Current goals can be found in the Care Plan section) Acute Rehab PT Goals Patient Stated Goal: return home PT Goal Formulation: With patient/family Time For Goal Achievement: 08/29/15 Potential to Achieve Goals: Good    Frequency Min  3X/week   Barriers to discharge Decreased caregiver support dgtr works and pt thinks neighbor can stay with her during the day    Co-evaluation               End of Session Equipment Utilized During Treatment: Gait belt Activity Tolerance: Patient tolerated treatment well Patient left: in chair;with call bell/phone within reach;with chair alarm set;with nursing/sitter in room;with family/visitor present Nurse Communication: Mobility status;Weight bearing status;Precautions    Functional Assessment Tool Used: clinical judgement Functional Limitation: Mobility: Walking and moving around Mobility: Walking and Moving Around Current Status [phone]): At least 1 percent but less than 20 percent impaired, limited or restricted Mobility: Walking and Moving Around Goal Status [phone]): At least 1 percent but less than 20 percent impaired, limited or restricted    Time: 0849-0915 PT Time Calculation (min) (ACUTE ONLY): 26 min   Charges:   PT Evaluation $Initial PT Evaluation Tier I: 1 Procedure PT Treatments $Therapeutic Activity: 8-22 mins   PT G Codes:   PT G-Codes **NOT FOR INPATIENT CLASS** Functional Assessment Tool Used: clinical judgement Functional Limitation: Mobility: Walking and moving around Mobility: Walking and Moving Around Current Status ([id number]): At least 1 percent but less than 20 percent impaired, limited or restricted Mobility: Walking and Moving Around Goal Status [phone]): At least 1 percent but less than 20 percent impaired, limited or restricted    Pamela Braun 08/15/2015, 9:28 AM Pamela Braun, PT [phone]

## 2015-08-16 ENCOUNTER — Observation Stay (HOSPITAL_COMMUNITY): Payer: Medicare HMO

## 2015-08-16 MED ORDER — IPRATROPIUM-ALBUTEROL 0.5-2.5 (3) MG/3ML IN SOLN
3.0000 mL | Freq: Three times a day (TID) | RESPIRATORY_TRACT | Status: DC | PRN
  Administered 2015-08-16: 3 mL via RESPIRATORY_TRACT
  Filled 2015-08-16: qty 3

## 2015-08-16 NOTE — Progress Notes (Signed)
Occupational Therapy Treatment Patient Details Name: Pamela KarvonenMaxine Braun MRN: [id number] DOB: 1925/08/18 Today's Date: 08/16/2015    History of present illness Pt admitted with Left radius fx after fall at home s/p ORIF and open carpal tunnel release. PMHx: HTN, vertigo, thyroid dz, bil shoulder surgeries and sciatica.   OT comments  Pt with pain, lethargy suspect due to medication, and nausea with emesis this session (RN aware). Pt requiring mod hand held assist +2 for stand pivot transfer [facility], [facility] to chair. Attempted use of hemi walker x 2; both attempts unsuccessful. Pt reports she feels very weak and like her L knee is buckling underneath her when in standing. Pt impulsive with transfers; attempting to sit prior to chair being behind her. Educated pt on edema management techniques; elevate LUE over heart. Will continue to follow acutely.    Follow Up Recommendations  Home health OT;Supervision/Assistance - 24 hour    Equipment Recommendations  None recommended by OT    Recommendations for Other Services      Precautions / Restrictions Precautions Precautions: Fall Restrictions Weight Bearing Restrictions: Yes LUE Weight Bearing: Weight bear through elbow only       Mobility Bed Mobility               General bed mobility comments: Pt sitting EOB at start of session  Transfers Overall transfer level: Needs assistance Equipment used: 2 person hand held assist Transfers: Stand Pivot Transfers   Stand pivot transfers: Mod assist;+2 physical assistance       General transfer comment: Pt with difficulty performing transfers this session. Mod assist +2 given. Attempted sit to stand with use of hemi walker x 2; both attempts unsuccessful. +2 hand held assist given for stand pivot transfer from EOB to [facility], BSC to chair    Balance Overall balance assessment: Needs assistance         Standing balance support: Bilateral upper extremity supported Standing  balance-Leahy Scale: Poor Standing balance comment: 2 person hand held assist required to maintain balance in standing.                   ADL Overall ADL's : Needs assistance/impaired                         Toilet Transfer: Moderate assistance;+2 for physical assistance;Stand-pivot;BSC Toilet Transfer Details (indicate cue type and reason): Pt mod assist +2 for stand pivot transfer from EOB to [facility]. Attempted x 2 with use of hemi walker; both attempts unsuccessful. 2 person hand held assist used for transfer. Pt reports feeling like her L knee is buckling underneath her. Pt attempting to sit before [facility] behind her. VC for hand placement and technique throughout. Toileting- Clothing Manipulation and Hygiene: Total assistance;Sit to/from stand       Functional mobility during ADLs: Moderate assistance;+2 for physical assistance (for stand pivot ) General ADL Comments: Pt with difficulty performing sit to stand from EOB this session; required +2 assist. Pt impulsive with transfers, attempting to sit prior to chair being behind her. Pt with nausea with emesis this session; RN notified. Educated on edema management techniques; elevation above level of heart.      Vision                     Perception     Praxis      Cognition   Behavior During Therapy: Impulsive Overall Cognitive Status: Within Functional Limits for  tasks assessed                       Extremity/Trunk Assessment               Exercises     Shoulder Instructions       General Comments      Pertinent Vitals/ Pain       Pain Assessment: 0-10 Pain Score: 8  Pain Location: LUE Pain Descriptors / Indicators: Aching;Grimacing Pain Intervention(s): Limited activity within patient's tolerance;Monitored during session;Premedicated before session;Repositioned  Home Living                                          Prior Functioning/Environment               Frequency Min 2X/week     Progress Toward Goals  OT Goals(current goals can now be found in the care plan section)  Progress towards OT goals: Not progressing toward goals - comment (Pt with pain, lethargy suspect due to medication)  Acute Rehab OT Goals Patient Stated Goal: not stated  Plan Discharge plan remains appropriate    Co-evaluation                 End of Session Equipment Utilized During Treatment: Gait belt   Activity Tolerance Patient limited by lethargy;Patient limited by pain   Patient Left in chair;with call bell/phone within reach;with chair alarm set;with nursing/sitter in room   Nurse Communication Mobility status;Other (comment) (pt with nausea with emesis )        Time: 1610-9604 OT Time Calculation (min): 16 min  Charges: OT General Charges $OT Visit: 1 Procedure OT Treatments $Self Care/Home Management : 8-22 mins  Gaye Alken M.S., OTR/L Pager: [phone]  08/16/2015, 3:32 PM

## 2015-08-16 NOTE — Progress Notes (Signed)
OT Cancellation Note  Patient Details Name: Dannielle KarvonenMaxine Genna MRN: [id number] DOB: [birth year]   Cancelled Treatment:    Reason Eval/Treat Not Completed: Pain limiting ability to participate;Fatigue/lethargy limiting ability to participate. Pt reports she is in a lot of pain currently and just received pain medication. Pt lethargic, suspect due to medication, not able to keep eyes open when talking with therapist. Will check back for OT session as time allows and pt is appropriate.  Gaye AlkenBailey A Franciscojavier Wronski M.S., OTR/L Pager: [phone]  08/16/2015, 9:34 AM

## 2015-08-16 NOTE — Progress Notes (Signed)
PT Cancellation Note  Patient Details Name: Pamela Braun MRN: [id number] DOB: 29-Jun-1926   Cancelled Treatment:    Reason Eval/Treat Not Completed: Fatigue/lethargy limiting ability to participate Pt too lethargic at this time to participate in therapy. PT will check on pt later as time allows.    Derek MoundKellyn R Roldan Laforest Demetrice Amstutz, PTA Pager: [phone]   08/16/2015, 1:44 PM

## 2015-08-16 NOTE — Care Management Obs Status (Signed)
MEDICARE OBSERVATION STATUS NOTIFICATION   Patient Details  Name: Pamela Braun MRN: [id number] Date of Birth: [birth year]   Medicare Observation Status Notification Given:   Yes    Durenda GuthrieBrady, Matraca Hunkins Naomi, RN 08/16/2015, 11:10 AM

## 2015-08-16 NOTE — Progress Notes (Signed)
Subjective: 2 Days Post-Op Procedure(s) (LRB): OPEN REDUCTION INTERNAL FIXATION (ORIF) WRIST FRACTURE AND COMPARTMENT SYNDROME RELEASE (Left) Patient reports pain as moderate.   She is tolerating a regular diet.  She notes no new complaints.  She still maintains good sensation to the fingers.  She has had no acute worsening. Overall she looks much improved. She does have postsurgical pain which is appropriate to her injury and compartment syndrome phenomenon.  Family is with her and I discussed all issues.  At present juncture she is ambulatory. She is not short of breath and has no chest pain or chest discomfort. She denies cough.  Objective: Vital signs in last 24 hours: Temp:  [97.9 F (36.6 C)-98.4 F (36.9 C)] 98.4 F (36.9 C) (01/02 0617) Pulse Rate:  [73-89] 89 (01/02 0617) Resp:  [16-18] 18 (01/02 0617) BP: (141-146)/(55-66) 146/66 mmHg (01/02 0617) SpO2:  [98 %-99 %] 98 % (01/02 0617)  Intake/Output from previous day: 01/01 0701 - 01/02 0700 In: 1773.8 [P.O.:315; I.V.:1208.8; IV Piggyback:150] Out: 12 [Drains:12] Intake/Output this shift:     Recent Labs  08/14/15 2136  HGB 12.1    Recent Labs  08/14/15 2136  WBC 10.2  RBC 4.54  HCT 37.2  PLT 156    Recent Labs  08/14/15 2136  NA 139  K 4.3  CL 103  CO2 23  BUN 25*  CREATININE 0.86  GLUCOSE 136*  CALCIUM 9.5   No results for input(s): LABPT, INR in the last 72 hours. Physical exam: ABD soft Neurovascular intact Sensation intact distally No cellulitis present Compartment soft Lower extremity examination shows no signs of infection dystrophy or vascular compromise.  I have gone ahead and performed a dressing change today. All drains were removed and she had new bandage applied including Xeroform, gauze, webril, kerlex and a short arm splint both dorsal and volar slabs of fiberglass were used. She tolerated the dressing change well.  I spent a great deal of time with her going over range  of motion massage and edema control and other measures which will be vitally important to her.  It 87 she has a lot of arthritis and a chronic contracture about the middle finger. We're work really hard to get her back to restoration of functional semblance  She and I discussed all issues with the family present at bedside. Assessment/Plan: 2 Days Post-Op Procedure(s) (LRB): OPEN REDUCTION INTERNAL FIXATION (ORIF) WRIST FRACTURE AND COMPARTMENT SYNDROME RELEASE (Left) Plan for discharge tomorrow Discharge home with home health I discharged her oxygen We will continue IV. I have discussed with the family all issues and written appropriate orders.  She looks fairly stable today. I want her to go very slow and easy.  We'll plan for discharge in the morning if she looks well and try to transition her off IV pain medicine Gabriellia Rempel III,Zoejane Gaulin M 08/16/2015, 11:30 AM

## 2015-08-16 NOTE — Progress Notes (Signed)
Orthopedic Tech Progress Note Patient Details:  Pamela KarvonenMaxine Braun [birth year] [id number]  Ortho Devices Type of Ortho Device: Volar splint, Ace wrap Ortho Device/Splint Location: lue Ortho Device/Splint Interventions: Application As ordered by Dr. Kaleen OdeaGramig  [name] 08/16/2015, 11:23 AM

## 2015-08-16 NOTE — Care Management Note (Signed)
Case Management Note  Patient Details  Name: Dannielle KarvonenMaxine Gullo MRN: [id number] Date of Birth: May 20, 1926  Subjective/Objective:  82 yr old female fell at home resulting in aleft radius fracture, patient under went a left radius ORIF.                  Action/Plan: Case manager spoke with patient, her daughter and son concerning discharge and DME needs. Choice was offered for Home Health, [facility] was [facility]. Patient's daughter states she lives with her mom but isn't there 24/7. Patient's son lives next door. PTA patient has been independent. Case manager also provided [employer] information and brochure.    Expected Discharge Date:   08/17/15               Expected Discharge Plan:  Home w Home Health Services  In-House Referral:     Discharge planning Services  CM Consult  Post Acute Care Choice:  Home Health, Durable Medical Equipment Choice offered to:     DME Arranged:  Other see comment (Hemi walker) DME Agency:  [facility]  HH Arranged:  PT HH Agency:  [facility]  Status of Service:  Completed, signed off  Medicare Important Message Given:    Date Medicare IM Given:    Medicare IM give by:    Date Additional Medicare IM Given:    Additional Medicare Important Message give by:     If discussed at Long Length of Stay Meetings, dates discussed:    Additional Comments:  Durenda GuthrieBrady, Jeralyn Nolden Naomi, RN 08/16/2015, 11:12 AM

## 2015-08-17 ENCOUNTER — Encounter (HOSPITAL_COMMUNITY): Payer: Self-pay | Admitting: Orthopedic Surgery

## 2015-08-17 ENCOUNTER — Observation Stay (HOSPITAL_COMMUNITY): Payer: Medicare HMO

## 2015-08-17 MED ORDER — TAPENTADOL HCL 50 MG PO TABS: 50.0000 mg | ORAL_TABLET | Freq: Four times a day (QID) | ORAL | Status: DC | PRN

## 2015-08-17 MED ORDER — TAPENTADOL HCL 50 MG PO TABS: 50.0000 mg | ORAL_TABLET | ORAL | Status: DC | PRN

## 2015-08-17 NOTE — Progress Notes (Signed)
Patient ID: Pamela KarvonenMaxine Braun, female   DOB: [birth year], 82 y.o.   MRN: [id number] The patient is reevaluated that this evening, as earlier this morning she was noted to be quite sedate from her pain medication. Her son is currently at bedside. We discussed all issues with she and her son at length and approximately 30 minutes duration. She has had long-standing problems with her left knee she's had a prior [facility]. It has been recommended that she undergoes a left knee replacement. Since the fall she has been unable to weight-bear due to the left knee and complains of significant amount of pain at this juncture. Overall the left upper extremity is doing quite well. We have implemented a case care management consult for potential rehabilitation facility for discharge purposes. She and the family understand this and will consider this based on the cost etc. She is noted to have an effusion about the left knee she's quite tender medially she is able to elicit straight leg raise, she is generally tender with any manipulation of the knee. Radiographs showed tricompartmental arthritis as well as effusion.  I discussed with her and her son we will perform an MRI to rule out occult  fracture or ligamentous derangement. The family states she does have a knee brace at home and they're going to bring this to the hospital for use tomorrow when attempting therapy. When all overall questions at length. We will reevaluate her tomorrow.

## 2015-08-17 NOTE — Progress Notes (Signed)
Physical Therapy Treatment Patient Details Name: Pamela Braun MRN: [id number] DOB: June 08, 1926 Today's Date: 08/17/2015    History of Present Illness Pt admitted with Left radius fx after fall at home s/p ORIF and open carpal tunnel release. PMHx: HTN, vertigo, thyroid dz, bil shoulder surgeries and sciatica.    PT Comments    Patient unable to make progress toward mobility goals this session due to pain in L LE. Pt able to ambulate 2 ft with L knee buckling and inability to fully bear weight through L LE. Overall mobility level of min/mod A +2 for transfers and gait. Pt currently needs 24 hour assistance and reported that her daughter works during the day and she has unconfirmed help from neighbor for possibly 2 hours a day. Therapist spoke with pt about possible SNF placement for further skilled PT services before returning home and pt is agreeable. Continue to progress  as tolerated.   Follow Up Recommendations  SNF     Equipment Recommendations  Other (comment) (hemiwalker)    Recommendations for Other Services       Precautions / Restrictions Precautions Precautions: Fall Restrictions Weight Bearing Restrictions: Yes LUE Weight Bearing: Weight bear through elbow only    Mobility  Bed Mobility               General bed mobility comments: OOB in chair upon arrival  Transfers Overall transfer level: Needs assistance Equipment used: Hemi-walker Transfers: Sit to/from Stand Sit to Stand: Min assist;+2 physical assistance (from recliner)         General transfer comment: Pt NWB through L LE for transfers; min A +2 to power up into sitting and min A for scooting hips forward in chair with use of bed pad; vc for hand placement and technique; extra time and min A for achieving upright posture; pt with R lateral lean due to difficulty with L LE WB  Ambulation/Gait Ambulation/Gait assistance: Mod assist;+2 safety/equipment Ambulation Distance (Feet): 2 Feet Assistive  device: Hemi-walker Gait Pattern/deviations: Shuffle;Antalgic;Narrow base of support;Trunk flexed     General Gait Details: vc for sequence; heavy WB to R side; unable to widen BOS or increase WS to L LE; L knee buckling with each step and mod A +2 for regaining balance; no significant LOB    Stairs            Wheelchair Mobility    Modified Rankin (Stroke Patients Only)       Balance Overall balance assessment: Needs assistance Sitting-balance support: Feet supported Sitting balance-Leahy Scale: Fair     Standing balance support: Single extremity supported Standing balance-Leahy Scale: Poor                      Cognition Arousal/Alertness: Awake/alert Behavior During Therapy: WFL for tasks assessed/performed                        Exercises      General Comments        Pertinent Vitals/Pain Pain Assessment: 0-10 Pain Score: 8  Pain Location: L wrist but mostly in L LE posterior knee Pain Descriptors / Indicators: Aching;Sore Pain Intervention(s): Limited activity within patient's tolerance;Monitored during session;Repositioned    Home Living                      Prior Function            PT Goals (current goals can now be found in  the care plan section) Acute Rehab PT Goals Patient Stated Goal: not stated PT Goal Formulation: With patient/family Time For Goal Achievement: 08/29/15 Potential to Achieve Goals: Good Progress towards PT goals: Not progressing toward goals - comment (limited by pain)    Frequency  Min 3X/week    PT Plan Discharge plan needs to be updated    Co-evaluation             End of Session Equipment Utilized During Treatment: Gait belt Activity Tolerance: Patient limited by pain Patient left: in chair;with call bell/phone within reach;with chair alarm set     Time: 8295-62131146-1203 PT Time Calculation (min) (ACUTE ONLY): 17 min  Charges:  $Gait Training: 8-22 mins                    G  Codes:      Derek MoundKellyn R Ouida Abeyta Keegen Heffern, PTA Pager: [phone]   08/17/2015, 12:39 PM

## 2015-08-17 NOTE — Progress Notes (Signed)
Occupational Therapy Treatment Patient Details Name: Pamela Braun MRN: [id number] DOB: 1926-04-09 Today's Date: 08/17/2015    History of present illness Pt admitted with Left radius fx after fall at home s/p ORIF and open carpal tunnel release. PMHx: HTN, vertigo, thyroid dz, bil shoulder surgeries and sciatica.   OT comments  Pt not progressing towards occupational therapy goals - goals downgraded (see care plan below) and now recommending SNF for post-acute rehab stay to increase independence and safety with ADLs and functional mobility since pt does not have 24/7 supervision at home. Pt required mod +2 assist for transfers and mod-max assist for bed mobility. Will continue to follow acutely to address OT needs and goals.    Follow Up Recommendations  SNF;Supervision/Assistance - 24 hour    Equipment Recommendations  Other (comment) (TBD in next venue)    Recommendations for Other Services      Precautions / Restrictions Precautions Precautions: Fall Restrictions Weight Bearing Restrictions: Yes LUE Weight Bearing: Weight bear through elbow only       Mobility Bed Mobility Overal bed mobility: Needs Assistance Bed Mobility: Supine to Sit;Sit to Supine     Supine to sit: Mod assist Sit to supine: Max assist   General bed mobility comments: Mod-max assist for trunk support and to progerss LEs onto bed. Verbal cues for hand placement and technique to avoid weight-bearing through LUE.  Transfers Overall transfer level: Needs assistance Equipment used: Hemi-walker Transfers: Sit to/from Stand Sit to Stand: Min assist;+2 physical assistance Stand pivot transfers: Mod assist;+2 physical assistance       General transfer comment: Pt with severe bilateral knee buckling, small shuffling steps, and very narrow BOS with [facility]. Pt required verbal cues for step sequence, safe hand placement on seated surfaces. Pt sat x2 unexpectedly before in a safe position  to do so despite mod verbal cues to wait until legs touched surface.     Balance Overall balance assessment: Needs assistance Sitting-balance support: No upper extremity supported;Feet supported Sitting balance-Leahy Scale: Fair     Standing balance support: Single extremity supported;During functional activity Standing balance-Leahy Scale: Poor Standing balance comment: Heavy reliance on hemi-walker and physical assist from therapist to maintain balance                   ADL Overall ADL's : Needs assistance/impaired                         Toilet Transfer: Moderate assistance;+2 for physical assistance;Stand-pivot;Ambulation;BSC;Cueing for safety;Cueing for sequencing (hemi-walker) Toilet Transfer Details (indicate cue type and reason): Verbal cues for step sequence and safe hand placement on seated surfaces before sitting. Verbal cues for safe body position before sitting - pt suddenly attempting to sit without warning. Toileting- Clothing Manipulation and Hygiene: Maximal assistance;Cueing for safety;Sit to/from stand Toileting - Clothing Manipulation Details (indicate cue type and reason): Cues for full hip extension and to increase BOS to improve balance     Functional mobility during ADLs: Moderate assistance;+2 for physical assistance General ADL Comments: Pt impulsive during session and attempting to sit before in a safe position to do so despite cues. Pt with significant bilateral knee buckling requiring mod +2 assist to complete all transfers. Pt required verbal cues to avoid weight-bearing throught LUE. Reviewed edema prevention strategies (AROM, massage, UE positioning) and SNF for post-acute rehab stay.      Vision  Perception     Praxis      Cognition   Behavior During Therapy: WFL for tasks assessed/performed Overall Cognitive Status: Impaired/Different from baseline Area of Impairment: Attention;Following  commands;Safety/judgement;Awareness;Problem solving   Current Attention Level: Selective    Following Commands: Follows one step commands inconsistently;Follows one step commands with increased time Safety/Judgement: Decreased awareness of safety;Decreased awareness of deficits Awareness: Emergent Problem Solving: Slow processing;Decreased initiation;Difficulty sequencing;Requires verbal cues;Requires tactile cues General Comments: No family member present to determine cognitive baseline. Pt answered therapist's questions regarding edema strategies inappropriately with information about her recent fall.    Extremity/Trunk Assessment               Exercises     Shoulder Instructions       General Comments      Pertinent Vitals/ Pain       Pain Assessment: 0-10 Pain Score: 8  Pain Location: L wrist and LLE behind knee Pain Descriptors / Indicators: Constant;Aching Pain Intervention(s): Limited activity within patient's tolerance;Monitored during session;Repositioned  Home Living                                          Prior Functioning/Environment              Frequency Min 2X/week     Progress Toward Goals  OT Goals(current goals can now be found in the care plan section)  Progress towards OT goals: Goals drowngraded-see care plan  Acute Rehab OT Goals Patient Stated Goal: to go home OT Goal Formulation: With patient Time For Goal Achievement: 08/22/15 Potential to Achieve Goals: Good ADL Goals Pt Will Perform Upper Body Bathing: with set-up;with supervision;with caregiver independent in assisting;sitting Pt Will Perform Lower Body Dressing: with min assist;with caregiver independent in assisting;sit to/from stand Pt Will Transfer to Toilet: with mod assist;ambulating;bedside commode Additional ADL Goal #1: Pt will independently verbalize 3 edema management techniques for LUE.  Plan Discharge plan needs to be updated     Co-evaluation                 End of Session Equipment Utilized During Treatment: Gait belt;Other (comment) (hemi-walker)   Activity Tolerance Patient tolerated treatment well   Patient Left in bed;with call bell/phone within reach   Nurse Communication Mobility status;Precautions    Functional Assessment Tool Used: clinical judgment Functional Limitation: Self care Self Care Current Status (Z6109): At least 40 percent but less than 60 percent impaired, limited or restricted Self Care Goal Status (U0454): At least 20 percent but less than 40 percent impaired, limited or restricted   Time: 1423-1447 OT Time Calculation (min): 24 min  Charges: OT G-codes **NOT FOR INPATIENT CLASS** Functional Assessment Tool Used: clinical judgment Functional Limitation: Self care Self Care Current Status (U9811): At least 40 percent but less than 60 percent impaired, limited or restricted Self Care Goal Status (B1478): At least 20 percent but less than 40 percent impaired, limited or restricted OT General Charges $OT Visit: 1 Procedure OT Treatments $Self Care/Home Management : 23-37 mins  Nils Pyle, OTR/L Pager: [phone] 08/17/2015, 3:05 PM

## 2015-08-17 NOTE — Progress Notes (Signed)
Subjective: 3 Days Post-Op Procedure(s) (LRB): OPEN REDUCTION INTERNAL FIXATION (ORIF) WRIST FRACTURE AND COMPARTMENT SYNDROME RELEASE (Left) Patient is very sedate this a.m., Received pain medication earlier. She denies signficant pain, nausea, or vomiting. Voiding without difficulties, does not recall bm. No family currently in room.   Objective: Vital signs in last 24 hours: Temp:  [98.4 F (36.9 C)-98.9 F (37.2 C)] 98.4 F (36.9 C) (01/03 0422) Pulse Rate:  [84-92] 86 (01/03 0422) Resp:  [18-20] 18 (01/03 0422) BP: (119-127)/(46-48) 119/46 mmHg (01/03 0422) SpO2:  [96 %-99 %] 98 % (01/03 0422)  Intake/Output from previous day: 01/02 0701 - 01/03 0700 In: 360 [P.O.:360] Out: -  Intake/Output this shift:     Recent Labs  08/14/15 2136  HGB 12.1    Recent Labs  08/14/15 2136  WBC 10.2  RBC 4.54  HCT 37.2  PLT 156    Recent Labs  08/14/15 2136  NA 139  K 4.3  CL 103  CO2 23  BUN 25*  CREATININE 0.86  GLUCOSE 136*  CALCIUM 9.5   No results for input(s): LABPT, INR in the last 72 hours.  PE: VSS, AF, O2 sat 98% Filed Vitals:   08/16/15 2149 08/17/15 0422  BP:  119/46  Pulse: 88 86  Temp:  98.4 F (36.9 C)  Resp: 18 18   Very sedate, requires verbal stimuli to participate in examination LUE: digits with mild edema, no pain with PROM, limited AROM, can maintain a fist, states sensation intact to Median Nerve, R/U nerve, splint clean dry and intact  Radiographs: Dg Knee 1-2 Views Left  08/16/2015  CLINICAL DATA:  Per patient she fell yesterday and cannot remember if she fell on her knees or not. She is having bilateral knee pain, but her left knee is more painful on the anterior surface. EXAM: LEFT KNEE - 1-2 VIEW COMPARISON:  None. FINDINGS: Significant degenerative changes are identified in the knee, involving medial, lateral, and patellofemoral compartments. No acute fracture identified. There is thickening of the anterior skin of the knee, which may  indicate edema. Suspect joint effusion. IMPRESSION: 1. Moderate tricompartmental degenerative changes. 2. Joint effusion. 3. Anterior skin edema. Electronically Signed   By: Norva PavlovElizabeth  Brown M.D.   On: 08/16/2015 19:17   Assessment/Plan: 3 Days Post-Op Procedure(s) (LRB): OPEN REDUCTION INTERNAL FIXATION (ORIF) WRIST FRACTURE AND COMPARTMENT SYNDROME RELEASE (Left) Patient has not yet been able to participate with P.T., OT has recommended 24 obs and HHOT.  I have discussed with patient and contacted daughter via telephone in regards to D/C plans. Therapy will re-evaluate this afternoon, pending reevaluation consideration for D/C home with HHPT vs inpatient rehab facility. Discussed with nursing limiting pain medications and decreasing Nucynta to 50 mg 1 po q 6 to 8 hours.Tentative discharge pending.  Sundai Probert L 08/17/2015, 8:44 AM

## 2015-08-18 ENCOUNTER — Encounter (HOSPITAL_COMMUNITY): Payer: Self-pay | Admitting: General Practice

## 2015-08-18 MED ORDER — CEPHALEXIN 500 MG PO CAPS: 500.0000 mg | ORAL_CAPSULE | Freq: Four times a day (QID) | ORAL | Status: AC

## 2015-08-18 MED ORDER — TAPENTADOL HCL 50 MG PO TABS: 50.0000 mg | ORAL_TABLET | Freq: Four times a day (QID) | ORAL | Status: AC | PRN

## 2015-08-18 NOTE — Discharge Summary (Signed)
Physician Discharge Summary  Patient ID: Pamela Braun MRN: [id number] DOB/AGE: May 03, 1926 82 y.o.  Admit date: 08/14/2015 Discharge date:  08/18/2015  Admission Diagnoses: fractured left wrist Past Medical History  Diagnosis Date  . Hypertension   . Vertigo   . Rosacea   . Thyroid disease   . Sciatica   . Complication of anesthesia   . PONV (postoperative nausea and vomiting)   . Shortness of breath dyspnea   . GERD (gastroesophageal reflux disease)   . Arthritis     OA    Discharge Diagnoses:  Active Problems:   Distal radius fracture, left   Surgeries: Procedure(s): OPEN REDUCTION INTERNAL FIXATION (ORIF) WRIST FRACTURE AND COMPARTMENT SYNDROME RELEASE on 08/14/2015 - 08/15/2015    Consultants:   none  Discharged Condition: Improved  Hospital Course: Pamela KarvonenMaxine Sciortino is an 82 y.o. female who was admitted 08/14/2015 with a chief complaint of Chief Complaint  Patient presents with  . Fall    arm pain  , and found to have a diagnosis of fractured left wrist.  They were brought to the operating room on 08/14/2015 - 08/15/2015 and underwent Procedure(s): OPEN REDUCTION INTERNAL FIXATION (ORIF) WRIST FRACTURE AND COMPARTMENT SYNDROME RELEASE.    They were given perioperative antibiotics: Anti-infectives    Start     Dose/Rate Route Frequency Ordered Stop   08/18/15 0000  cephALEXin (KEFLEX) 500 MG capsule     500 mg Oral 4 times daily 08/18/15 1716     08/15/15 1015  ceFAZolin (ANCEF) IVPB 1 g/50 mL premix     1 g 100 mL/hr over 30 Minutes Intravenous 3 times per day 08/15/15 0209     08/15/15 0221  ceFAZolin (ANCEF) 1-5 GM-% IVPB    Comments:  Nolon NationsYu, Hyang   : cabinet override      08/15/15 0221 08/15/15 0354   08/15/15 0215  ceFAZolin (ANCEF) IVPB 1 g/50 mL premix     1 g 100 mL/hr over 30 Minutes Intravenous NOW 08/15/15 0209 08/15/15 0252    .  They were given sequential compression devices, early ambulation, and  for DVT prophylaxis.  Recent vital signs: Patient  Vitals for the past 24 hrs:  BP Temp Temp src Pulse Resp SpO2  08/18/15 1300 (!) 120/52 mmHg - - 79 18 96 %  08/18/15 1015 (!) 153/51 mmHg - - - - -  08/18/15 0508 (!) 153/51 mmHg 99 F (37.2 C) Oral 92 20 92 %  08/17/15 2205 (!) 131/50 mmHg 99.2 F (37.3 C) Oral 75 20 93 %  .  Recent laboratory studies: Mr Knee Left  Wo Contrast  08/18/2015  CLINICAL DATA:  Chronic left knee pain. Status post fall 08/15/2015. Initial encounter. EXAM: MRI OF THE LEFT KNEE WITHOUT CONTRAST TECHNIQUE: Multiplanar, multisequence MR imaging of the knee was performed. No intravenous contrast was administered. COMPARISON:  Plain films left knee 08/15/2014. FINDINGS: Patient motion degrades the exam. MENISCI Medial meniscus: The posterior horn is severely degenerated and diminutive. There appears to be a radial tear through the root of the posterior horn. Lateral meniscus: Degenerative signal without discrete tear is identified. LIGAMENTS Cruciates: The anterior cruciate ligament appears torn. The posterior cruciate ligament is intact. Collaterals:  Intact. CARTILAGE Patellofemoral: Hyaline cartilage along the medial patellar facet is denuded. Medial:  Hyaline cartilage is completely denuded. Lateral:  Markedly degenerated. Joint:  Large joint effusion. Popliteal Fossa: Baker's cyst measures approximately 3.5 cm transverse by 2.6 cm AP by 7.3 cm craniocaudal. Extensor Mechanism:  Intact. Bones: No  fracture or worrisome marrow lesion. Prominent osteophytosis is seen about the knee. Subchondral sclerosis about the medial compartment is also identified. IMPRESSION: Negative for fracture or other acute abnormality. Dominant finding is severe tricompartmental osteoarthritis. Severely degenerated and diminutive posterior horn of the medial meniscus with a likely radial tear through the root of the posterior horn. The anterior cruciate ligament appears chronically torn. Large joint effusion and large Baker's cyst. Electronically Signed    By: Drusilla Kanner M.D.   On: 08/18/2015 07:40    Discharge Medications:     Medication List    TAKE these medications        acetaminophen 500 MG tablet  Commonly known as:  TYLENOL  Take 500 mg by mouth every 6 (six) hours as needed for mild pain.     aspirin 81 MG chewable tablet  Chew 81 mg by mouth.     Calcium-Vitamin D-Vitamin K 500-500-40 MG-UNT-MCG Chew  Chew 500 mg by mouth.     cephALEXin 500 MG capsule  Commonly known as:  KEFLEX  Take 1 capsule (500 mg total) by mouth 4 (four) times daily.     diclofenac sodium 1 % Gel  Commonly known as:  VOLTAREN  Place 2 g onto the skin daily as needed. For leg pain per patient     levothyroxine 125 MCG tablet  Commonly known as:  SYNTHROID, LEVOTHROID  Take 125 mcg by mouth daily before breakfast.     metoprolol 50 MG tablet  Commonly known as:  LOPRESSOR  Take 50 mg by mouth.     ranitidine 150 MG tablet  Commonly known as:  ZANTAC  Take 150 mg by mouth.     tapentadol 50 MG Tabs tablet  Commonly known as:  NUCYNTA  Take 1 tablet (50 mg total) by mouth every 6 (six) hours as needed for moderate pain or severe pain.     VISION-VITE PRESERVE PO  Take by mouth.        Diagnostic Studies: Dg Forearm Left  08/14/2015  CLINICAL DATA:  82 year old female with wrist pain EXAM: LEFT FOREARM - 2 VIEW COMPARISON:  None. FINDINGS: There is fracture of the distal radius with extension into the articular surface of the wrist. Evaluation of the bones study fracture is limited due to osteopenia. No other acute fracture identified. The soft tissues are grossly unremarkable. IMPRESSION: Interarticular fracture of the distal radius. Electronically Signed   By: Elgie Collard M.D.   On: 08/14/2015 20:29   Dg Knee 1-2 Views Left  08/16/2015  CLINICAL DATA:  Per patient she fell yesterday and cannot remember if she fell on her knees or not. She is having bilateral knee pain, but her left knee is more painful on the anterior  surface. EXAM: LEFT KNEE - 1-2 VIEW COMPARISON:  None. FINDINGS: Significant degenerative changes are identified in the knee, involving medial, lateral, and patellofemoral compartments. No acute fracture identified. There is thickening of the anterior skin of the knee, which may indicate edema. Suspect joint effusion. IMPRESSION: 1. Moderate tricompartmental degenerative changes. 2. Joint effusion. 3. Anterior skin edema. Electronically Signed   By: Norva Pavlov M.D.   On: 08/16/2015 19:17   Mr Knee Left  Wo Contrast  08/18/2015  CLINICAL DATA:  Chronic left knee pain. Status post fall 08/15/2015. Initial encounter. EXAM: MRI OF THE LEFT KNEE WITHOUT CONTRAST TECHNIQUE: Multiplanar, multisequence MR imaging of the knee was performed. No intravenous contrast was administered. COMPARISON:  Plain films left knee 08/15/2014. FINDINGS:  Patient motion degrades the exam. MENISCI Medial meniscus: The posterior horn is severely degenerated and diminutive. There appears to be a radial tear through the root of the posterior horn. Lateral meniscus: Degenerative signal without discrete tear is identified. LIGAMENTS Cruciates: The anterior cruciate ligament appears torn. The posterior cruciate ligament is intact. Collaterals:  Intact. CARTILAGE Patellofemoral: Hyaline cartilage along the medial patellar facet is denuded. Medial:  Hyaline cartilage is completely denuded. Lateral:  Markedly degenerated. Joint:  Large joint effusion. Popliteal Fossa: Baker's cyst measures approximately 3.5 cm transverse by 2.6 cm AP by 7.3 cm craniocaudal. Extensor Mechanism:  Intact. Bones: No fracture or worrisome marrow lesion. Prominent osteophytosis is seen about the knee. Subchondral sclerosis about the medial compartment is also identified. IMPRESSION: Negative for fracture or other acute abnormality. Dominant finding is severe tricompartmental osteoarthritis. Severely degenerated and diminutive posterior horn of the medial meniscus  with a likely radial tear through the root of the posterior horn. The anterior cruciate ligament appears chronically torn. Large joint effusion and large Baker's cyst. Electronically Signed   By: Drusilla Kanner M.D.   On: 08/18/2015 07:40    They benefited maximally from their hospital stay and there were no complications. His care consult was implemented for potential inpatient rehabilitation facilities, however, the family has decided to pursue discharged to home as she has good  support. The patient will contact [facility] in regards to follow-up for her left knee we have had a lengthy discussion regards to the left knee, the MRI findings and potential treatment plans. I'll discuss with the patient as well as her daughter's all issues in regards to her discharge. We have spent a great deal of time in this evening discussing these issues at length. All questions were encouraged and answered. She will follow up with [place] in 10-12 days, we will have our office contact her in regards to her follow-up date and time. At the time of her first postoperative visit we will remove her dressing about the left wrist removed her sutures and obtain radiographs. During the interim, she is going to contact [facility] in regard to her left knee. He will utilize her knee immobilizer and use her platform walker as she understands she should not be weightbearing to the left wrist and hand but can weight-bear through her left forearm.   Disposition:      Discharge Instructions    Call MD / Call 911    Complete by:  As directed   If you experience chest pain or shortness of breath, CALL 911 and be transported to the hospital emergency room.  If you develope a fever above 101 F, pus (white drainage) or increased drainage or redness at the wound, or calf pain, call your surgeon's office.     Call MD / Call 911    Complete by:  As directed   If you experience chest pain or shortness of breath, CALL  911 and be transported to the hospital emergency room.  If you develope a fever above 101 F, pus (white drainage) or increased drainage or redness at the wound, or calf pain, call your surgeon's office.     Constipation Prevention    Complete by:  As directed   Drink plenty of fluids.  Prune juice may be helpful.  You may use a stool softener, such as Colace (over the counter) 100 mg twice a day.  Use MiraLax (over the counter) for constipation as needed.     Constipation  Prevention    Complete by:  As directed   Drink plenty of fluids.  Prune juice may be helpful.  You may use a stool softener, such as Colace (over the counter) 100 mg twice a day.  Use MiraLax (over the counter) for constipation as needed.     Diet - low sodium heart healthy    Complete by:  As directed      Diet - low sodium heart healthy    Complete by:  As directed      Discharge instructions    Complete by:  As directed   We recommend that you to take vitamin C 1000 mg a day to promote healing. We also recommend that if you require  pain medicine that you take a stool softener to prevent constipation as most pain medicines will have constipation side effects. We recommend either Peri-Colace or Senokot and recommend that you also consider adding MiraLAX to prevent the constipation affects from pain medicine if you are required to use them. These medicines are over the counter and maybe purchased at a local pharmacy. A cup of yogurt and a probiotic can also be helpful during the recovery process as the medicines can disrupt your intestinal environment.  Keep bandage clean and dry.  Call for any problems.  No smoking.  Criteria for driving a car: you should be off your pain medicine for 7-8 hours, able to drive one handed(confident), thinking clearly and feeling able in your judgement to drive. Continue elevation as it will decrease swelling.  If instructed by MD move your fingers within the confines of the bandage/splint.  Use  ice if instructed by your MD. Call immediately for any sudden loss of feeling in your hand/arm or change in functional abilities of the extremity.     Discharge instructions    Complete by:  As directed   .Keep bandage clean and dry.  Call for any problems.  No smoking.  Criteria for driving a car: you should be off your pain medicine for 7-8 hours, able to drive one handed(confident), thinking clearly and feeling able in your judgement to drive. Continue elevation as it will decrease swelling.  If instructed by MD move your fingers within the confines of the bandage/splint.  Use ice if instructed by your MD. Call immediately for any sudden loss of feeling in your hand/arm or change in functional abilities of the extremity. .We recommend that you to take vitamin C 1000 mg a day to promote healing. We also recommend that if you require  pain medicine that you take a stool softener to prevent constipation as most pain medicines will have constipation side effects. We recommend either Peri-Colace or Senokot and recommend that you also consider adding MiraLAX to prevent the constipation affects from pain medicine if you are required to use them. These medicines are over the counter and maybe purchased at a local pharmacy. A cup of yogurt and a probiotic can also be helpful during the recovery process as the medicines can disrupt your intestinal environment.     Increase activity slowly as tolerated    Complete by:  As directed      Increase activity slowly as tolerated    Complete by:  As directed           Follow-up Information    Follow up with [facility].   Why:  Someone from [facility] will contact you concerning start date and time for therapy   Contact  information:   [address] [place] [address] [phone]       Follow up with Karen Chafe, MD.   Specialty:  Orthopedic Surgery   Why:  Our office will call you with follow-up appointment  date and time   Contact information:   [address] [place] [address] [phone]        Signed: Sheran Lawless 08/18/2015, 5:19 PM

## 2015-08-18 NOTE — Progress Notes (Signed)
Physical Therapy Treatment Patient Details Name: Pamela Braun MRN: [id number] DOB: 1925/12/03 Today's Date: 08/18/2015    History of Present Illness Pt admitted with Left radius fx after fall at home s/p ORIF and open carpal tunnel release. PMHx: HTN, vertigo, thyroid dz, bil shoulder surgeries and sciatica.    PT Comments    Good progress today. Additional family present and now report that they can provide adequate support at home. Pt ambulatory without loss of balance using a platform walker for support. Safely able to navigate single step prior to d/c. Family with no further questions. Patient eager to return home. Patient will continue to benefit from skilled physical therapy services to further improve independence with functional mobility.   Follow Up Recommendations  Home health PT;Supervision for mobility/OOB (refused SNF - Family can now provide assist at home)     Equipment Recommendations  Other (comment) (hemiwalker)    Recommendations for Other Services       Precautions / Restrictions Precautions Precautions: Fall Restrictions Weight Bearing Restrictions: Yes LUE Weight Bearing: Weight bear through elbow only    Mobility  Bed Mobility Overal bed mobility: Needs Assistance Bed Mobility: Supine to Sit     Supine to sit: Supervision     General bed mobility comments: supervision for safety. VC for technique and WB precautions through LUE. No physical assist needed  Transfers Overall transfer level: Needs assistance Equipment used: Hemi-walker Transfers: Sit to/from Stand Sit to Stand: Min guard         General transfer comment: Close guard for safety. VC for hand placement and to maintain NWB through LUE. Performed from bed and recliner. Slow to rise but performed without PT physical assistance.  Ambulation/Gait Ambulation/Gait assistance: Min guard Ambulation Distance (Feet): 30 Feet Assistive device: Hemi-walker Gait Pattern/deviations: Step-to  pattern;Decreased step length - right;Decreased stance time - left;Decreased stride length;Antalgic Gait velocity: decreased Gait velocity interpretation: Below normal speed for age/gender General Gait Details: Pre gait task with weight acceptance on LLE with cues for knee extension for quad activation on LT. Educated on safe DME use with a Lt platform walker. No overt buckling noted. Cues for walker placement for proximity at times. Fair control with turns and backing up. Close guard for safety.   Stairs Stairs: Yes Stairs assistance: Min guard Stair Management: No rails;Step to pattern;Backwards;With walker Number of Stairs: 1 General stair comments: Educated on safe stair navigation using platform walker, backwards approach. Performed single step x2, similar to home environment. Able to verbalize understanding second attempt. Family present and observed this task with no further questions.  Wheelchair Mobility    Modified Rankin (Stroke Patients Only)       Balance                                    Cognition Arousal/Alertness: Awake/alert Behavior During Therapy: WFL for tasks assessed/performed Overall Cognitive Status: Within Functional Limits for tasks assessed                      Exercises      General Comments        Pertinent Vitals/Pain Pain Assessment: 0-10 Pain Score:  ("It's better but still sore") Pain Location: LUE, Lt knee Pain Descriptors / Indicators: Sore Pain Intervention(s): Monitored during session    Home Living  Prior Function            PT Goals (current goals can now be found in the care plan section) Acute Rehab PT Goals Patient Stated Goal: not stated PT Goal Formulation: With patient/family Time For Goal Achievement: 08/29/15 Potential to Achieve Goals: Good Progress towards PT goals: Progressing toward goals    Frequency  Min 3X/week    PT Plan Discharge plan needs to be  updated    Co-evaluation             End of Session Equipment Utilized During Treatment: Gait belt Activity Tolerance: Patient tolerated treatment well Patient left: in chair;with call bell/phone within reach;with chair alarm set;with family/visitor present     Time: [id number] (- 20 minutes non billable while PA spoke with patient) PT Time Calculation (min) (ACUTE ONLY): 46 min  Charges:  $Gait Training: 8-22 mins $Therapeutic Activity: 8-22 mins                    G Codes:      [place], Shawnee Gambone S 08/18/2015, 6:01 PM Sunday SpillersLogan Secor [place], [place] [phone]

## 2015-08-18 NOTE — Clinical Social Work Note (Addendum)
CSW received referral for SNF, CSW spoke with patient and family, they would like to have patient go home with home health.  Case discussed with case manager, and plan is to discharge home with home health.  CSW to sign off please re-consult if social work needs arise.  Ervin KnackEric R. Sybol Morre, MSW, [employer]CSWA [phone]

## 2015-08-18 NOTE — Progress Notes (Signed)
Case manager spoke with patient, her son and daughter concerning discharge plans. Cm contacted Judeth CornfieldStephanie with [facility] to inform of patient's plan to go home.

## 2015-08-18 NOTE — Discharge Instructions (Signed)

## 2015-08-18 NOTE — Progress Notes (Signed)
Have called Dr Brunetta GeneraGramigs office about AVS not being complete they said they would take care of it

## 2015-12-06 ENCOUNTER — Other Ambulatory Visit: Payer: Self-pay

## 2015-12-06 DIAGNOSIS — Z1231 Encounter for screening mammogram for malignant neoplasm of breast: Secondary | ICD-10-CM

## 2015-12-08 ENCOUNTER — Ambulatory Visit
Admission: RE | Admit: 2015-12-08 | Discharge: 2015-12-08 | Disposition: A | Discharge status: Home / Self Care | Payer: Medicare HMO | Source: Ambulatory Visit

## 2015-12-08 DIAGNOSIS — Z1231 Encounter for screening mammogram for malignant neoplasm of breast: Secondary | ICD-10-CM

## 2016-08-01 ENCOUNTER — Other Ambulatory Visit: Payer: Self-pay | Admitting: Family Medicine

## 2016-08-01 DIAGNOSIS — E2839 Other primary ovarian failure: Secondary | ICD-10-CM

## 2016-08-11 ENCOUNTER — Other Ambulatory Visit: Payer: Medicare HMO

## 2016-08-16 ENCOUNTER — Ambulatory Visit
Admission: RE | Admit: 2016-08-16 | Discharge: 2016-08-16 | Disposition: A | Discharge status: Home / Self Care | Payer: Medicare HMO | Source: Ambulatory Visit | Attending: Family Medicine | Admitting: Family Medicine

## 2016-08-16 DIAGNOSIS — E2839 Other primary ovarian failure: Secondary | ICD-10-CM

## 2017-01-09 ENCOUNTER — Other Ambulatory Visit: Payer: Self-pay | Admitting: Family Medicine

## 2017-01-09 DIAGNOSIS — Z1231 Encounter for screening mammogram for malignant neoplasm of breast: Secondary | ICD-10-CM

## 2017-01-10 ENCOUNTER — Ambulatory Visit
Admission: RE | Admit: 2017-01-10 | Discharge: 2017-01-10 | Disposition: A | Discharge status: Home / Self Care | Payer: Medicare HMO | Source: Ambulatory Visit | Attending: Family Medicine | Admitting: Family Medicine

## 2017-01-10 DIAGNOSIS — Z1231 Encounter for screening mammogram for malignant neoplasm of breast: Secondary | ICD-10-CM

## 2017-11-29 ENCOUNTER — Other Ambulatory Visit: Payer: Self-pay | Admitting: Family Medicine

## 2017-11-29 DIAGNOSIS — Z1231 Encounter for screening mammogram for malignant neoplasm of breast: Secondary | ICD-10-CM

## 2018-01-17 ENCOUNTER — Ambulatory Visit
Admission: RE | Admit: 2018-01-17 | Discharge: 2018-01-17 | Disposition: A | Discharge status: Home / Self Care | Payer: Medicare HMO | Source: Ambulatory Visit | Attending: Family Medicine | Admitting: Family Medicine

## 2018-01-17 DIAGNOSIS — Z1231 Encounter for screening mammogram for malignant neoplasm of breast: Secondary | ICD-10-CM

## 2019-01-30 ENCOUNTER — Other Ambulatory Visit: Payer: Self-pay | Admitting: Family Medicine

## 2019-01-30 DIAGNOSIS — Z1231 Encounter for screening mammogram for malignant neoplasm of breast: Secondary | ICD-10-CM

## 2019-03-14 ENCOUNTER — Ambulatory Visit
Admission: RE | Admit: 2019-03-14 | Discharge: 2019-03-14 | Disposition: A | Discharge status: Home / Self Care | Payer: Medicare HMO | Source: Ambulatory Visit | Attending: Family Medicine | Admitting: Family Medicine

## 2019-03-14 ENCOUNTER — Other Ambulatory Visit: Payer: Self-pay

## 2019-03-14 DIAGNOSIS — Z1231 Encounter for screening mammogram for malignant neoplasm of breast: Secondary | ICD-10-CM

## 2020-03-12 ENCOUNTER — Other Ambulatory Visit: Payer: Self-pay | Admitting: Family Medicine

## 2020-03-12 DIAGNOSIS — Z1231 Encounter for screening mammogram for malignant neoplasm of breast: Secondary | ICD-10-CM

## 2020-03-25 ENCOUNTER — Other Ambulatory Visit: Payer: Self-pay

## 2020-03-25 ENCOUNTER — Ambulatory Visit
Admission: RE | Admit: 2020-03-25 | Discharge: 2020-03-25 | Disposition: A | Discharge status: Home / Self Care | Payer: Medicare HMO | Source: Ambulatory Visit | Attending: Family Medicine | Admitting: Family Medicine

## 2020-03-25 DIAGNOSIS — Z1231 Encounter for screening mammogram for malignant neoplasm of breast: Secondary | ICD-10-CM

## 2021-02-24 ENCOUNTER — Other Ambulatory Visit: Payer: Self-pay | Admitting: Family Medicine

## 2021-02-24 DIAGNOSIS — Z1231 Encounter for screening mammogram for malignant neoplasm of breast: Secondary | ICD-10-CM

## 2021-04-21 ENCOUNTER — Ambulatory Visit
Admission: RE | Admit: 2021-04-21 | Discharge: 2021-04-21 | Disposition: A | Discharge status: Home / Self Care | Payer: Medicare HMO | Source: Ambulatory Visit | Attending: Family Medicine | Admitting: Family Medicine

## 2021-04-21 ENCOUNTER — Other Ambulatory Visit: Payer: Self-pay

## 2021-04-21 DIAGNOSIS — Z1231 Encounter for screening mammogram for malignant neoplasm of breast: Secondary | ICD-10-CM

## 2021-05-29 ENCOUNTER — Emergency Department (HOSPITAL_BASED_OUTPATIENT_CLINIC_OR_DEPARTMENT_OTHER): Payer: Medicare HMO

## 2021-05-29 ENCOUNTER — Encounter (HOSPITAL_BASED_OUTPATIENT_CLINIC_OR_DEPARTMENT_OTHER): Payer: Self-pay

## 2021-05-29 ENCOUNTER — Emergency Department (HOSPITAL_BASED_OUTPATIENT_CLINIC_OR_DEPARTMENT_OTHER)
Admission: EM | Admit: 2021-05-29 | Discharge: 2021-05-30 | Disposition: A | Discharge status: Home / Self Care | Payer: Medicare HMO | Attending: Emergency Medicine | Admitting: Emergency Medicine

## 2021-05-29 ENCOUNTER — Other Ambulatory Visit: Payer: Self-pay

## 2021-05-29 DIAGNOSIS — W19XXXA Unspecified fall, initial encounter: Secondary | ICD-10-CM | POA: Diagnosis not present

## 2021-05-29 DIAGNOSIS — S51811A Laceration without foreign body of right forearm, initial encounter: Secondary | ICD-10-CM | POA: Diagnosis not present

## 2021-05-29 DIAGNOSIS — Z79899 Other long term (current) drug therapy: Secondary | ICD-10-CM | POA: Diagnosis not present

## 2021-05-29 DIAGNOSIS — I10 Essential (primary) hypertension: Secondary | ICD-10-CM | POA: Insufficient documentation

## 2021-05-29 DIAGNOSIS — Z20822 Contact with and (suspected) exposure to covid-19: Secondary | ICD-10-CM | POA: Insufficient documentation

## 2021-05-29 DIAGNOSIS — Z23 Encounter for immunization: Secondary | ICD-10-CM | POA: Diagnosis not present

## 2021-05-29 DIAGNOSIS — T148XXA Other injury of unspecified body region, initial encounter: Secondary | ICD-10-CM

## 2021-05-29 DIAGNOSIS — Y9389 Activity, other specified: Secondary | ICD-10-CM | POA: Insufficient documentation

## 2021-05-29 DIAGNOSIS — R519 Headache, unspecified: Secondary | ICD-10-CM | POA: Insufficient documentation

## 2021-05-29 DIAGNOSIS — Z7982 Long term (current) use of aspirin: Secondary | ICD-10-CM | POA: Insufficient documentation

## 2021-05-29 DIAGNOSIS — S59911A Unspecified injury of right forearm, initial encounter: Secondary | ICD-10-CM | POA: Diagnosis present

## 2021-05-29 LAB — CBC WITH DIFFERENTIAL/PLATELET
Abs Immature Granulocytes: 0.03 10*3/uL (ref 0.00–0.07)
Basophils Absolute: 0 10*3/uL (ref 0.0–0.1)
Basophils Relative: 0 %
Eosinophils Absolute: 0.1 10*3/uL (ref 0.0–0.5)
Eosinophils Relative: 2 %
HCT: 35.5 % — ABNORMAL LOW (ref 36.0–46.0)
Hemoglobin: 11.7 g/dL — ABNORMAL LOW (ref 12.0–15.0)
Immature Granulocytes: 0 %
Lymphocytes Relative: 26 %
Lymphs Abs: 1.9 10*3/uL (ref 0.7–4.0)
MCH: 28.3 pg (ref 26.0–34.0)
MCHC: 33 g/dL (ref 30.0–36.0)
MCV: 86 fL (ref 80.0–100.0)
Monocytes Absolute: 0.9 10*3/uL (ref 0.1–1.0)
Monocytes Relative: 12 %
Neutro Abs: 4.3 10*3/uL (ref 1.7–7.7)
Neutrophils Relative %: 60 %
Platelets: 143 10*3/uL — ABNORMAL LOW (ref 150–400)
RBC: 4.13 MIL/uL (ref 3.87–5.11)
RDW: 14.3 % (ref 11.5–15.5)
WBC: 7.2 10*3/uL (ref 4.0–10.5)
nRBC: 0 % (ref 0.0–0.2)

## 2021-05-29 MED ORDER — TETANUS-DIPHTH-ACELL PERTUSSIS 5-2.5-18.5 LF-MCG/0.5 IM SUSY
0.5000 mL | PREFILLED_SYRINGE | Freq: Once | INTRAMUSCULAR | Status: AC
  Administered 2021-05-30: 0.5 mL via INTRAMUSCULAR
  Filled 2021-05-29: qty 0.5

## 2021-05-29 NOTE — ED Notes (Signed)
Pamela Braun- [phone] would like to be contacted on wound care and d/c instructions.

## 2021-05-29 NOTE — ED Notes (Signed)
Patient transported to CT 

## 2021-05-29 NOTE — ED Triage Notes (Signed)
Patient reports to the ER for fall, skin tears, and reports she was coming out of her kitchen and sustained a mechanical fall. Injuries to the right forearm, shoulder, and bilateral knees.

## 2021-05-29 NOTE — ED Provider Notes (Signed)
MEDCENTER [facility] EMERGENCY DEPT Provider Note   CSN: [id number] Arrival date & time: 05/29/21  2035     History Chief Complaint  Patient presents with   [facility] Pamela Braun is a 87 y.o. female.  The history is provided by the patient, a relative, a friend and medical records. No language interpreter was used.  Fall This is a recurrent problem. The current episode started 6 to 12 hours ago. The problem occurs daily (over last 3 days). Associated symptoms include headaches. Pertinent negatives include no chest pain, no abdominal pain and no shortness of breath. The symptoms are aggravated by standing and drinking. Nothing relieves the symptoms. She has tried nothing for the symptoms. The treatment provided no relief.      Past Medical History:  Diagnosis Date   Arthritis    OA   Complication of anesthesia    GERD (gastroesophageal reflux disease)    Hypertension    PONV (postoperative nausea and vomiting)    Rosacea    Sciatica    Shortness of breath dyspnea    Thyroid disease    Vertigo     Patient Active Problem List   Diagnosis Date Noted   Distal radius fracture, left 08/15/2015    Past Surgical History:  Procedure Laterality Date   ABDOMINAL HYSTERECTOMY     BREAST EXCISIONAL BIOPSY     left   BUNIONECTOMY     KNEE SURGERY     Right x 2   ORIF WRIST FRACTURE Left 08/14/2015   Procedure: OPEN REDUCTION INTERNAL FIXATION (ORIF) WRIST FRACTURE AND COMPARTMENT SYNDROME RELEASE;  Surgeon: Dominica Severin, MD;  Location: [facility] OR;  Service: Orthopedics;  Laterality: Left;   SHOULDER SURGERY     Bilateral   THYROIDECTOMY     TONSILLECTOMY       OB History   No obstetric history on file.     No family history on file.  Social History   Tobacco Use   Smoking status: Never   Smokeless tobacco: Never  Substance Use Topics   Alcohol use: No    Alcohol/week: 0.0 standard drinks   Drug use: No    Home Medications Prior to Admission  medications   Medication Sig Start Date End Date Taking? Authorizing Provider  Calcium-Vitamin D-Vitamin K 500-500-40 MG-UNT-MCG CHEW Chew 500 mg by mouth.   Yes [provider]  levothyroxine (SYNTHROID, LEVOTHROID) 125 MCG tablet Take 125 mcg by mouth daily before breakfast.   Yes [provider]  metoprolol (LOPRESSOR) 50 MG tablet Take 50 mg by mouth. 11/10/13  Yes [provider]  Multiple Vitamins-Minerals (VISION-VITE PRESERVE PO) Take by mouth.   Yes [provider]  acetaminophen (TYLENOL) 500 MG tablet Take 500 mg by mouth every 6 (six) hours as needed for mild pain.     [provider]  aspirin 81 MG chewable tablet Chew 81 mg by mouth.    [provider]  cephALEXin (KEFLEX) 500 MG capsule Take 1 capsule (500 mg total) by mouth 4 (four) times daily. 08/18/15   Karie Chimera, PA-C  diclofenac sodium (VOLTAREN) 1 % GEL Place 2 g onto the skin daily as needed. For leg pain per patient 08/25/13   [provider]  ranitidine (ZANTAC) 150 MG tablet Take 150 mg by mouth.    [provider]  tapentadol (NUCYNTA) 50 MG TABS tablet Take 1 tablet (50 mg total) by mouth every 6 (six) hours as needed for moderate pain or  severe pain. 08/18/15   Karie Chimera, PA-C    Allergies    Anesthetics, amide; Celecoxib; Codeine; Ibuprofen; Meperidine; Naproxen; Sulfamethoxazole-trimethoprim; Naloxone; Pentazocine; and Azithromycin  Review of Systems   Review of Systems  Constitutional:  Positive for fatigue. Negative for chills, diaphoresis and fever.  HENT:  Negative for congestion.   Eyes:  Negative for visual disturbance.  Respiratory:  Positive for cough. Negative for chest tightness, shortness of breath and wheezing.   Cardiovascular:  Negative for chest pain, palpitations and leg swelling.  Gastrointestinal:  Negative for abdominal pain, constipation, diarrhea, nausea and vomiting.  Genitourinary:  Positive for dysuria.  Negative for flank pain.  Musculoskeletal:  Negative for back pain, neck pain and neck stiffness.  Skin:  Positive for wound. Negative for rash.  Neurological:  Positive for light-headedness and headaches. Negative for dizziness, weakness and numbness.  Psychiatric/Behavioral:  Negative for agitation.   All other systems reviewed and are negative.  Physical Exam Updated Vital Signs BP (!) 167/73   Pulse 80   Temp 98.5 F (36.9 C) (Oral)   Resp 17   Ht 5\' 3"  (1.6 m)   Wt 72.1 kg   LMP  (LMP Unknown) Comment: pt. is 87   SpO2 95%   BMI 28.17 kg/m   Physical Exam Vitals and nursing note reviewed.  Constitutional:      General: She is not in acute distress.    Appearance: She is well-developed. She is not ill-appearing, toxic-appearing or diaphoretic.  HENT:     Head: Normocephalic and atraumatic.     Nose: No congestion or rhinorrhea.     Mouth/Throat:     Mouth: Mucous membranes are dry.     Pharynx: No oropharyngeal exudate or posterior oropharyngeal erythema.  Eyes:     Extraocular Movements: Extraocular movements intact.     Conjunctiva/sclera: Conjunctivae normal.     Pupils: Pupils are equal, round, and reactive to light.  Cardiovascular:     Rate and Rhythm: Normal rate and regular rhythm.     Pulses: Normal pulses.     Heart sounds: No murmur heard. Pulmonary:     Effort: Pulmonary effort is normal. No respiratory distress.     Breath sounds: Normal breath sounds. No wheezing, rhonchi or rales.  Chest:     Chest wall: No tenderness.  Abdominal:     General: Abdomen is flat.     Palpations: Abdomen is soft.     Tenderness: There is no abdominal tenderness. There is no right CVA tenderness, left CVA tenderness, guarding or rebound.  Musculoskeletal:        General: Tenderness and signs of injury present.     Cervical back: Neck supple. No tenderness.  Skin:    General: Skin is warm and dry.     Capillary Refill: Capillary refill takes less than 2 seconds.      Findings: No erythema.  Neurological:     General: No focal deficit present.     Mental Status: She is alert.     Sensory: No sensory deficit.     Motor: No weakness.  Psychiatric:        Mood and Affect: Mood normal.    ED Results / Procedures / Treatments   Labs (all labs ordered are listed, but only abnormal results are displayed) Labs Reviewed  CBC WITH DIFFERENTIAL/PLATELET - Abnormal; Notable for the following components:      Result Value   Hemoglobin 11.7 (*)    HCT 35.5 (*)  Platelets 143 (*)    All other components within normal limits  COMPREHENSIVE METABOLIC PANEL - Abnormal; Notable for the following components:   Glucose, Bld 113 (*)    BUN 28 (*)    Calcium 8.4 (*)    Total Protein 6.2 (*)    Alkaline Phosphatase 34 (*)    All other components within normal limits  URINE CULTURE  RESP PANEL BY RT-PCR (FLU A&B, COVID) ARPGX2  URINALYSIS, ROUTINE W REFLEX MICROSCOPIC  TSH    EKG EKG Interpretation  Date/Time:  Sunday May 29 2021 23:27:22 EDT Ventricular Rate:  73 PR Interval:  147 QRS Duration: 137 QT Interval:  455 QTC Calculation: 502 R Axis:   -66 Text Interpretation: Sinus tachycardia Multiform ventricular premature complexes Nonspecific IVCD with LAD Left ventricular hypertrophy Abnrm T, consider ischemia, anterolateral lds ST elevation, consider inferior injury No significant change since last tracing Confirmed by Ross Marcus ([id number]) on 05/29/2021 11:59:26 PM  Radiology DG Shoulder Right  Result Date: 05/29/2021 CLINICAL DATA:  Fall EXAM: RIGHT SHOULDER - 2+ VIEW COMPARISON:  None. FINDINGS: Prior right shoulder replacement. Degenerative changes in the right AC joint. No acute fracture, subluxation or dislocation. IMPRESSION: No acute bony abnormality. Electronically Signed   By: Charlett Nose M.D.   On: 05/29/2021 23:27   DG Elbow Complete Left  Result Date: 05/29/2021 CLINICAL DATA:  Fall EXAM: LEFT ELBOW - COMPLETE 3+ VIEW  COMPARISON:  None. FINDINGS: No acute bony abnormality. Specifically, no fracture, subluxation, or dislocation. No joint effusion. IMPRESSION: No acute bony abnormality. Electronically Signed   By: Charlett Nose M.D.   On: 05/29/2021 23:30   DG Forearm Right  Result Date: 05/29/2021 CLINICAL DATA:  Fall EXAM: RIGHT FOREARM - 2 VIEW COMPARISON:  None. FINDINGS: Advanced degenerative changes in the right wrist with joint space narrowing and spurring. Chondrocalcinosis in the triangular fibrocartilage. No acute fracture, subluxation or dislocation. Soft tissues are intact. IMPRESSION: No acute bony abnormality. Electronically Signed   By: Charlett Nose M.D.   On: 05/29/2021 23:28   DG Wrist Complete Right  Result Date: 05/29/2021 CLINICAL DATA:  Fall EXAM: RIGHT WRIST - COMPLETE 3+ VIEW COMPARISON:  None. FINDINGS: Advanced degenerative changes in the right wrist with joint space narrowing, spurring, and chondrocalcinosis. No acute bony abnormality. Specifically, no fracture, subluxation, or dislocation. IMPRESSION: No acute bony abnormality. Electronically Signed   By: Charlett Nose M.D.   On: 05/29/2021 23:29   CT Head Wo Contrast  Result Date: 05/29/2021 CLINICAL DATA:  Fall EXAM: CT HEAD WITHOUT CONTRAST TECHNIQUE: Contiguous axial images were obtained from the base of the skull through the vertex without intravenous contrast. COMPARISON:  None. FINDINGS: Brain: No evidence of acute infarction, hemorrhage, hydrocephalus, extra-axial collection or mass lesion/mass effect. Global cortical atrophy. Subcortical white matter and periventricular small vessel ischemic changes. Vascular: Intracranial atherosclerosis. Skull: Normal. Negative for fracture or focal lesion. Sinuses/Orbits: The visualized paranasal sinuses are essentially clear. The mastoid air cells are unopacified. Other: None. IMPRESSION: No evidence of acute intracranial abnormality. Atrophy with small vessel ischemic changes. Electronically  Signed   By: Charline Bills M.D.   On: 05/29/2021 22:57   DG Chest Portable 1 View  Result Date: 05/29/2021 CLINICAL DATA:  Fall EXAM: PORTABLE CHEST 1 VIEW COMPARISON:  06/20/2013 FINDINGS: Heart and mediastinal contours are within normal limits. No focal opacities or effusions. No acute bony abnormality. Aortic atherosclerosis. Bilateral shoulder replacements. No acute bony abnormality. IMPRESSION: No active disease. Electronically Signed   By: Caryn Bee  Dover M.D.   On: 05/29/2021 23:26   DG Knee Complete 4 Views Right  Result Date: 05/29/2021 CLINICAL DATA:  Fall several days ago with right knee pain, initial encounter EXAM: RIGHT KNEE - COMPLETE 4+ VIEW COMPARISON:  None. FINDINGS: Right knee prosthesis is noted. No acute fracture or dislocation is seen. No soft tissue abnormality is noted. IMPRESSION: No acute abnormality noted. Electronically Signed   By: Alcide Clever M.D.   On: 05/29/2021 23:31    Procedures Procedures   Medications Ordered in ED Medications  Tdap (BOOSTRIX) injection 0.5 mL (has no administration in time range)    ED Course  I have reviewed the triage vital signs and the nursing notes.  Pertinent labs & imaging results that were available during my care of the patient were reviewed by me and considered in my medical decision making (see chart for details).    MDM Rules/Calculators/A&P                           Peri Sheria Rosello is a 87 y.o. female with a past medical history significant for thyroid disease, vertigo, GERD, hypertension, and arthritis who presents with multiple falls and fatigue over the last few days.  According to patient, every day for the last 3 days patient has had falls.  She says they have been related to standing up or standing and drinking.  The fall today happened at home where she lives with a daughter and sustained multiple injuries.  She reports she has having some headache but denies any neck pain.  She reports that she has  vertigo and has been occasionally dizzy but not dizzy at this time.  She has pain in her right shoulder, her right forearm, right wrist, and her bilateral knees.  No other chest or abdominal pain.  She does report that she has had some cough recently and has also had some significant dysuria.  Patient denies any flank or back pain at this time.  She does not think she lost consciousness and denies any palpitations or chest pain leading to the falls.  She does report she has felt lightheaded and fatigued over the last few days.  On exam, no focal neurologic deficits with normal sensation and strength in extremities.  Good pulses in all extremities.  Patient has multiple skin tears on her right forearm and skin tear on her right shoulder.  All are hemostatic and no wounds appear to need suturing at this time.  Intact pulses.  Intact grip strength bilaterally.  Chest is nontender and lungs were clear without significant rhonchi.  Abdomen was nontender.  Hips nontender.  Patient has tenderness to the right knee but otherwise had normal sensation and strength in lower extremities.  Clinically I am concerned that with her cough and her urinary symptoms she may have occult infection leading to these falls over the last few days.  As she is fallen every day for the last 3 days I am also concerned about her safety at discharge unless she shows a marked improvement in her ambulatory status tonight.  We will get imaging to rule out acute traumatic injuries and the extremities as well as in her head.  CT head did not show acute intracranial abnormality.  Chest x-ray showed no pneumonia.  Extremity x-ray showed no evidence of fractures.  We will get laboratory work-up to look for occult infection and reassess the patient to determine her safety for discharge home.  Tetanus was updated.  Care transferred to oncoming team while waiting for reassessment and work-up to be completed.   Final Clinical Impression(s) / ED  Diagnoses Final diagnoses:  Fall, initial encounter  Multiple skin tears    Clinical Impression: 1. Fall, initial encounter   2. Multiple skin tears     Disposition: Care transferred to oncoming team while waiting for reassessment and work-up to be completed.  This note was prepared with assistance of Conservation officer, historic buildings. Occasional wrong-word or sound-a-like substitutions may have occurred due to the inherent limitations of voice recognition software.     Giovana Faciane, Canary Brim, MD 05/30/21 [phone]

## 2021-05-30 LAB — COMPREHENSIVE METABOLIC PANEL
ALT: 12 U/L (ref 0–44)
AST: 23 U/L (ref 15–41)
Albumin: 4.1 g/dL (ref 3.5–5.0)
Alkaline Phosphatase: 34 U/L — ABNORMAL LOW (ref 38–126)
Anion gap: 8 (ref 5–15)
BUN: 28 mg/dL — ABNORMAL HIGH (ref 8–23)
CO2: 29 mmol/L (ref 22–32)
Calcium: 8.4 mg/dL — ABNORMAL LOW (ref 8.9–10.3)
Chloride: 104 mmol/L (ref 98–111)
Creatinine, Ser: 0.83 mg/dL (ref 0.44–1.00)
GFR, Estimated: >60 mL/min (ref >60)
Glucose, Bld: 113 mg/dL — ABNORMAL HIGH (ref 70–99)
Potassium: 4 mmol/L (ref 3.5–5.1)
Sodium: 141 mmol/L (ref 135–145)
Total Bilirubin: 0.4 mg/dL (ref 0.3–1.2)
Total Protein: 6.2 g/dL — ABNORMAL LOW (ref 6.5–8.1)

## 2021-05-30 LAB — RESP PANEL BY RT-PCR (FLU A&B, COVID) ARPGX2
Influenza A by PCR: NEGATIVE
Influenza B by PCR: NEGATIVE
SARS Coronavirus 2 by RT PCR: NEGATIVE

## 2021-05-30 LAB — URINALYSIS, ROUTINE W REFLEX MICROSCOPIC
Bilirubin Urine: NEGATIVE
Glucose, UA: NEGATIVE mg/dL
Hgb urine dipstick: NEGATIVE
Ketones, ur: NEGATIVE mg/dL
Leukocytes,Ua: NEGATIVE
Nitrite: NEGATIVE
Protein, ur: NEGATIVE mg/dL
Specific Gravity, Urine: 1.015 (ref 1.005–1.030)
pH: 6.5 (ref 5.0–8.0)

## 2021-05-30 LAB — TSH: TSH: 1.511 u[IU]/mL (ref 0.350–4.500)

## 2021-05-30 NOTE — ED Provider Notes (Signed)
Patient signed out pending lab work.  Labs reviewed.  See clinical course below.  Preference is to go home.  She was ambulated in the hallway at her baseline without significant instability.  She does live with her daughter at home.  Do not see any indication for admission.  Will discharge home with primary care follow-up.  Physical Exam  BP (!) 192/93   Pulse 81   Temp 98.5 F (36.9 C) (Oral)   Resp 15   Ht 1.6 m (5\' 3" )   Wt 72.1 kg   LMP  (LMP Unknown) Comment: pt. is 87   SpO2 98%   BMI 28.17 kg/m   Physical Exam  ED Course/Procedures   Clinical Course as of 05/30/21 0240  Mon May 30, 2021  0230 Updated patient at the bedside.  Lab work-up is largely reassuring.  No obvious UTI.  No significant metabolic derangements.  Patient reports that she has had difficulty with frequent falls for almost 2 years.  She states mostly she does not know what makes her fall.  She at times feels dizzy but not all of the time.  Orthostatics are reassuring.  EKG without acute ischemic or arrhythmic changes.  Patient was ambulated in the hallway without difficulty or dizziness.  Her preference is to go home.  She does live with the daughter.  Recommend that she follow-up with her primary physician. [CH]    Clinical Course User Index [CH] Dione Mccombie, Jun 01, 2021, MD    Procedures  MDM    Problem List Items Addressed This Visit   None Visit Diagnoses     Fall, initial encounter    -  Primary   Multiple skin tears                 Adriane Gabbert, Mayer Masker, MD 05/30/21 [phone]

## 2021-05-30 NOTE — Discharge Instructions (Addendum)
You were seen today after a fall.  Your work-up is largely reassuring.  Follow-up closely with your primary physician.  Make sure that you are using your cane at home.

## 2021-05-31 LAB — URINE CULTURE: Culture: NO GROWTH

## 2021-06-03 ENCOUNTER — Other Ambulatory Visit: Payer: Self-pay

## 2021-06-03 ENCOUNTER — Encounter (HOSPITAL_BASED_OUTPATIENT_CLINIC_OR_DEPARTMENT_OTHER): Payer: Self-pay | Admitting: Obstetrics and Gynecology

## 2021-06-03 ENCOUNTER — Emergency Department (HOSPITAL_BASED_OUTPATIENT_CLINIC_OR_DEPARTMENT_OTHER)
Admission: EM | Admit: 2021-06-03 | Discharge: 2021-06-03 | Disposition: A | Discharge status: Home / Self Care | Payer: Medicare HMO | Attending: Emergency Medicine | Admitting: Emergency Medicine

## 2021-06-03 DIAGNOSIS — S41011D Laceration without foreign body of right shoulder, subsequent encounter: Secondary | ICD-10-CM | POA: Insufficient documentation

## 2021-06-03 DIAGNOSIS — X58XXXD Exposure to other specified factors, subsequent encounter: Secondary | ICD-10-CM | POA: Insufficient documentation

## 2021-06-03 DIAGNOSIS — T8130XS Disruption of wound, unspecified, sequela: Secondary | ICD-10-CM

## 2021-06-03 DIAGNOSIS — Z79899 Other long term (current) drug therapy: Secondary | ICD-10-CM | POA: Insufficient documentation

## 2021-06-03 DIAGNOSIS — S51811D Laceration without foreign body of right forearm, subsequent encounter: Secondary | ICD-10-CM | POA: Diagnosis not present

## 2021-06-03 DIAGNOSIS — T8133XS Disruption of traumatic injury wound repair, sequela: Secondary | ICD-10-CM | POA: Diagnosis present

## 2021-06-03 DIAGNOSIS — Z7982 Long term (current) use of aspirin: Secondary | ICD-10-CM | POA: Diagnosis not present

## 2021-06-03 DIAGNOSIS — Y848 Other medical procedures as the cause of abnormal reaction of the patient, or of later complication, without mention of misadventure at the time of the procedure: Secondary | ICD-10-CM | POA: Diagnosis not present

## 2021-06-03 DIAGNOSIS — I10 Essential (primary) hypertension: Secondary | ICD-10-CM | POA: Insufficient documentation

## 2021-06-03 MED ORDER — LIDOCAINE-EPINEPHRINE-TETRACAINE (LET) TOPICAL GEL: 3.0000 mL | Freq: Once | TOPICAL | Status: DC

## 2021-06-03 NOTE — ED Provider Notes (Signed)
MEDCENTER [facility] EMERGENCY DEPT Provider Note   CSN: [id number] Arrival date & time: 06/03/21  1118     History Chief Complaint  Patient presents with   Wound Check    Pamela Braun is a 87 y.o. female who presents to the emergency department with a wound complaint has been more worsening over the last few days.  Patient was seen and evaluated in the emergency department on 17th of October for some skin tears.  The patient was cleaned up and bandaged.  They did not replace the bandages for approximately 48 hours as the patient lost her discharge instructions.  Part of the dressing began healing and with the wound.  She was seen evaluated at her primary care doctor who suggested she come to the emergency department.  Her wound problems localized to the right distal forearm.  She denies any other complaints. No fever or chills.    Wound Check      Past Medical History:  Diagnosis Date   Arthritis    OA   Complication of anesthesia    GERD (gastroesophageal reflux disease)    Hypertension    PONV (postoperative nausea and vomiting)    Rosacea    Sciatica    Shortness of breath dyspnea    Thyroid disease    Vertigo     Patient Active Problem List   Diagnosis Date Noted   Distal radius fracture, left 08/15/2015    Past Surgical History:  Procedure Laterality Date   ABDOMINAL HYSTERECTOMY     BREAST EXCISIONAL BIOPSY     left   BUNIONECTOMY     KNEE SURGERY     Right x 2   ORIF WRIST FRACTURE Left 08/14/2015   Procedure: OPEN REDUCTION INTERNAL FIXATION (ORIF) WRIST FRACTURE AND COMPARTMENT SYNDROME RELEASE;  Surgeon: Dominica Severin, MD;  Location: [facility] OR;  Service: Orthopedics;  Laterality: Left;   SHOULDER SURGERY     Bilateral   THYROIDECTOMY     TONSILLECTOMY       OB History   No obstetric history on file.     No family history on file.  Social History   Tobacco Use   Smoking status: Never    Passive exposure: Never   Smokeless  tobacco: Never  Vaping Use   Vaping Use: Never used  Substance Use Topics   Alcohol use: No    Alcohol/week: 0.0 standard drinks   Drug use: No    Home Medications Prior to Admission medications   Medication Sig Start Date End Date Taking? Authorizing Provider  acetaminophen (TYLENOL) 500 MG tablet Take 500 mg by mouth every 6 (six) hours as needed for mild pain.     [provider]  aspirin 81 MG chewable tablet Chew 81 mg by mouth.    [provider]  Calcium-Vitamin D-Vitamin K 500-500-40 MG-UNT-MCG CHEW Chew 500 mg by mouth.    [provider]  cephALEXin (KEFLEX) 500 MG capsule Take 1 capsule (500 mg total) by mouth 4 (four) times daily. 08/18/15   Karie Chimera, PA-C  diclofenac sodium (VOLTAREN) 1 % GEL Place 2 g onto the skin daily as needed. For leg pain per patient 08/25/13   [provider]  levothyroxine (SYNTHROID, LEVOTHROID) 125 MCG tablet Take 125 mcg by mouth daily before breakfast.    [provider]  metoprolol (LOPRESSOR) 50 MG tablet Take 50 mg by mouth. 11/10/13   [provider]  Multiple Vitamins-Minerals (VISION-VITE PRESERVE PO) Take by mouth.  [provider]  ranitidine (ZANTAC) 150 MG tablet Take 150 mg by mouth.    [provider]  tapentadol (NUCYNTA) 50 MG TABS tablet Take 1 tablet (50 mg total) by mouth every 6 (six) hours as needed for moderate pain or severe pain. 08/18/15   Karie Chimera, PA-C    Allergies    Anesthetics, amide; Celecoxib; Codeine; Ibuprofen; Meperidine; Naproxen; Sulfamethoxazole-trimethoprim; Naloxone; Pentazocine; and Azithromycin  Review of Systems   Review of Systems  All other systems reviewed and are negative.  Physical Exam Updated Vital Signs BP (!) 173/86   Pulse 63   Temp 98.4 F (36.9 C)   Resp 16   LMP  (LMP Unknown) Comment: pt. is 87   SpO2 99%   Physical Exam Vitals and nursing note reviewed.  Constitutional:      Appearance: Normal  appearance.  HENT:     Head: Normocephalic and atraumatic.  Eyes:     General:        Right eye: No discharge.        Left eye: No discharge.     Conjunctiva/sclera: Conjunctivae normal.  Pulmonary:     Effort: Pulmonary effort is normal.  Skin:    General: Skin is warm and dry.     Findings: No rash.     Comments: Healing skin tears to the right distal forearm and right shoulder.  There is evidence of a dressing that has grown in with the new skin.  No surrounding erythema or obvious purulence.  Neurological:     General: No focal deficit present.     Mental Status: She is alert.  Psychiatric:        Mood and Affect: Mood normal.        Behavior: Behavior normal.    ED Results / Procedures / Treatments   Labs (all labs ordered are listed, but only abnormal results are displayed) Labs Reviewed - No data to display  EKG None  Radiology No results found.  Procedures Procedures   Medications Ordered in ED Medications  lidocaine-EPINEPHrine-tetracaine (LET) topical gel (3 mLs Topical Not Given 06/03/21 1245)    ED Course  I have reviewed the triage vital signs and the nursing notes.  Pertinent labs & imaging results that were available during my care of the patient were reviewed by me and considered in my medical decision making (see chart for details).    MDM Rules/Calculators/A&P                          Pamela Braun is a 87 y.o. female who presents to the emergency department for evaluation of a wound problem.  I was able to remove the groin and dressing with tweezers after soaking the dressing in running water for approximately 5 minutes.  The wound reopened and had some active bleeding which was controlled with direct pressure.  Bacitracin ointment was applied copiously and patient was wrapped with a nonadherent dressing with instructions to change the bandaging a few times per day.  Patient and caretaker demonstrated full understanding.  All questions and  concerns addressed.   Final Clinical Impression(s) / ED Diagnoses Final diagnoses:  Wound disruption, sequela    Rx / DC Orders ED Discharge Orders     None        Jolyn Lent 06/03/21 1245    Linwood Dibbles, MD 06/05/21 [phone]

## 2021-06-03 NOTE — ED Triage Notes (Signed)
Patient reports to the ER for wound that has bandage imbedded in the wound. Patient has seen PCP and wound care could not see patient so she was advised to come back for the ER

## 2021-06-03 NOTE — Discharge Instructions (Signed)
You were seen and evaluated in the emergency department today for further evaluation of a wound problem.  As we discussed, I have reopened the wound after removing the gauze.  We have placed bacitracin ointment with a nonadherent dressing over the wound today.  Please change the bandages 1-2 times per day and apply Neosporin to the area to prevent any infection.  I would like for you to follow-up with your primary care doctor within the next week to ensure we are going the right direction.  Please return to the emergency department sooner if you experience fever, chills, erythema to the wound, drainage from the wound, or any other concerns you might have.

## 2021-06-07 ENCOUNTER — Ambulatory Visit: Payer: Medicare HMO | Admitting: Physician Assistant

## 2021-07-15 ENCOUNTER — Encounter (HOSPITAL_BASED_OUTPATIENT_CLINIC_OR_DEPARTMENT_OTHER): Payer: Medicare HMO | Admitting: Internal Medicine

## 2022-01-12 ENCOUNTER — Emergency Department (HOSPITAL_COMMUNITY): Payer: Medicare HMO

## 2022-01-12 ENCOUNTER — Other Ambulatory Visit: Payer: Self-pay

## 2022-01-12 ENCOUNTER — Encounter (HOSPITAL_COMMUNITY): Payer: Self-pay

## 2022-01-12 ENCOUNTER — Emergency Department (HOSPITAL_COMMUNITY)
Admission: EM | Admit: 2022-01-12 | Discharge: 2022-01-12 | Disposition: A | Discharge status: Home / Self Care | Payer: Medicare HMO | Attending: Emergency Medicine | Admitting: Emergency Medicine

## 2022-01-12 DIAGNOSIS — R519 Headache, unspecified: Secondary | ICD-10-CM | POA: Diagnosis not present

## 2022-01-12 DIAGNOSIS — R109 Unspecified abdominal pain: Secondary | ICD-10-CM | POA: Diagnosis not present

## 2022-01-12 DIAGNOSIS — M545 Low back pain, unspecified: Secondary | ICD-10-CM | POA: Diagnosis not present

## 2022-01-12 DIAGNOSIS — M542 Cervicalgia: Secondary | ICD-10-CM | POA: Diagnosis not present

## 2022-01-12 DIAGNOSIS — I10 Essential (primary) hypertension: Secondary | ICD-10-CM | POA: Insufficient documentation

## 2022-01-12 DIAGNOSIS — S22000D Wedge compression fracture of unspecified thoracic vertebra, subsequent encounter for fracture with routine healing: Secondary | ICD-10-CM

## 2022-01-12 DIAGNOSIS — G459 Transient cerebral ischemic attack, unspecified: Secondary | ICD-10-CM | POA: Diagnosis not present

## 2022-01-12 DIAGNOSIS — R079 Chest pain, unspecified: Secondary | ICD-10-CM | POA: Diagnosis not present

## 2022-01-12 DIAGNOSIS — Z79899 Other long term (current) drug therapy: Secondary | ICD-10-CM | POA: Insufficient documentation

## 2022-01-12 DIAGNOSIS — M546 Pain in thoracic spine: Secondary | ICD-10-CM | POA: Diagnosis not present

## 2022-01-12 DIAGNOSIS — Z7982 Long term (current) use of aspirin: Secondary | ICD-10-CM | POA: Insufficient documentation

## 2022-01-12 LAB — COMPREHENSIVE METABOLIC PANEL
ALT: 13 U/L (ref 0–44)
AST: 22 U/L (ref 15–41)
Albumin: 3.6 g/dL (ref 3.5–5.0)
Alkaline Phosphatase: 60 U/L (ref 38–126)
Anion gap: 7 (ref 5–15)
BUN: 27 mg/dL — ABNORMAL HIGH (ref 8–23)
CO2: 27 mmol/L (ref 22–32)
Calcium: 8.5 mg/dL — ABNORMAL LOW (ref 8.9–10.3)
Chloride: 102 mmol/L (ref 98–111)
Creatinine, Ser: 1.1 mg/dL — ABNORMAL HIGH (ref 0.44–1.00)
GFR, Estimated: 47 mL/min — ABNORMAL LOW (ref >60)
Glucose, Bld: 100 mg/dL — ABNORMAL HIGH (ref 70–99)
Potassium: 4.7 mmol/L (ref 3.5–5.1)
Sodium: 136 mmol/L (ref 135–145)
Total Bilirubin: 0.4 mg/dL (ref 0.3–1.2)
Total Protein: 6.2 g/dL — ABNORMAL LOW (ref 6.5–8.1)

## 2022-01-12 LAB — URINALYSIS, ROUTINE W REFLEX MICROSCOPIC
Bilirubin Urine: NEGATIVE
Glucose, UA: NEGATIVE mg/dL
Hgb urine dipstick: NEGATIVE
Ketones, ur: NEGATIVE mg/dL
Nitrite: NEGATIVE
Protein, ur: NEGATIVE mg/dL
Specific Gravity, Urine: 1.011 (ref 1.005–1.030)
pH: 6 (ref 5.0–8.0)

## 2022-01-12 LAB — TROPONIN I (HIGH SENSITIVITY)
Troponin I (High Sensitivity): 9 ng/L (ref <18)
Troponin I (High Sensitivity): 9 ng/L (ref <18)

## 2022-01-12 LAB — CBC
HCT: 36.5 % (ref 36.0–46.0)
Hemoglobin: 11.5 g/dL — ABNORMAL LOW (ref 12.0–15.0)
MCH: 27.4 pg (ref 26.0–34.0)
MCHC: 31.5 g/dL (ref 30.0–36.0)
MCV: 86.9 fL (ref 80.0–100.0)
Platelets: 174 10*3/uL (ref 150–400)
RBC: 4.2 MIL/uL (ref 3.87–5.11)
RDW: 14.1 % (ref 11.5–15.5)
WBC: 7.2 10*3/uL (ref 4.0–10.5)
nRBC: 0 % (ref 0.0–0.2)

## 2022-01-12 MED ORDER — IOHEXOL 350 MG/ML SOLN
100.0000 mL | Freq: Once | INTRAVENOUS | Status: AC | PRN
  Administered 2022-01-12: 100 mL via INTRAVENOUS

## 2022-01-12 MED ORDER — SODIUM CHLORIDE 0.9 % IV BOLUS
500.0000 mL | Freq: Once | INTRAVENOUS | Status: AC
  Administered 2022-01-12: 500 mL via INTRAVENOUS

## 2022-01-12 NOTE — ED Notes (Signed)
C-collar removed per dr Darl Householder

## 2022-01-12 NOTE — ED Triage Notes (Signed)
Pt arrived to ED via EMS from home w/ c/o having tripped and fell today at 1130. Pt reported to EMS that she fell to her knees, no head injury or LOC. Pt then called her daughter and per her daughter pt had slurred speech which lasted about 30 minutes. Pt has also had increased number of falls over the last few weeks. Also reported was that pt has had intermittent CP and nausea x 1 month, but is not experiencing any of that today. EMS reports pt ambulated to stretcher w/ cane w/ steady gait. CBG 138, BP 170/77, HR 78, RR 18

## 2022-01-12 NOTE — ED Notes (Signed)
Patient to MRI.

## 2022-01-12 NOTE — ED Provider Notes (Signed)
  Physical Exam  BP (!) 145/89   Pulse 82   Temp 98.6 F (37 C)   Resp 17   LMP  (LMP Unknown) Comment: pt. is 87   SpO2 98%   Physical Exam  Procedures  Procedures  ED Course / MDM    Medical Decision Making Care assumed at 3 PM.  Patient had recurrent falls and had an episode of trouble speaking earlier today.  Symptoms completely resolved already.  Signout pending trauma scan and also MRI brain and labs and urinalysis  9:25 PM I reviewed her labs and independently reviewed her imaging studies.  Labs were unremarkable.  Troponin negative x2.  Patient is ambulating in the ED.  Trauma scan showed old T3 compression fractures.  MRI brain did not show any stroke.  At this point, patient is stable for discharge can follow-up outpatient.  Amount and/or Complexity of Data Reviewed Labs: ordered. Decision-making details documented in ED Course. Radiology: ordered and independent interpretation performed. Decision-making details documented in ED Course.  Risk Prescription drug management.          Charlynne Pander, MD 01/12/22 [phone]

## 2022-01-12 NOTE — Discharge Instructions (Signed)
You have some compression fractures that are old.  Your MRI and CT scan did not show a stroke today.  Please follow-up with your doctor  Return to ER if you have worse trouble speaking, weakness, numbness, trouble walking

## 2022-01-12 NOTE — ED Notes (Signed)
Patient and family updated on MRI status.

## 2022-01-12 NOTE — ED Provider Notes (Signed)
[facility] Provider Note   CSN: [id number] Arrival date & time: 01/12/22  1354     History  Chief Complaint  Patient presents with   Transient Ischemic Attack   Fall    [address] GauzeRodgers Maultsby is a [age] y.o. female.  HPI  88 year old female with past medical history of HTN presents to the emergency department for evaluation of head and spine pain status post fall.  Patient admits that she has been having increased falls over the past few weeks.  She describes the falls as mechanical.  No loss of consciousness or syncope.  This last fall today she fell down onto her knees and is now complaining of mainly lower back pain as well as whole spine pain and mild headache.  The daughter at bedside states after this fall she had what seemed like a slurred speech while she "gathered herself".  This was not witnessed by EMS on arrival.  They state that she ambulated to the stretcher with a steady gait with her cane which is baseline.  Intermittently for the past months she has been having episodes of nausea and mild chest pain that are brief, self resolved.  Currently she has no chest pain or nausea at this time.  No shortness of breath.  Denies any recent illness or fever.  No genitourinary symptoms.  Home Medications Prior to Admission medications   Medication Sig Start Date End Date Taking? Authorizing Provider  acetaminophen (TYLENOL) 500 MG tablet Take 500 mg by mouth every 6 (six) hours as needed for mild pain.     [provider]  aspirin 81 MG chewable tablet Chew 81 mg by mouth.    [provider]  Calcium-Vitamin D-Vitamin K 500-500-40 MG-UNT-MCG CHEW Chew 500 mg by mouth.    [provider]  cephALEXin (KEFLEX) 500 MG capsule Take 1 capsule (500 mg total) by mouth 4 (four) times daily. 08/18/15   Karie ChimeraBuchanan, Brian, PA-C  diclofenac sodium (VOLTAREN) 1 % GEL Place 2 g onto the skin daily as needed. For leg pain per patient 08/25/13    [provider]  levothyroxine (SYNTHROID, LEVOTHROID) 125 MCG tablet Take 125 mcg by mouth daily before breakfast.    [provider]  metoprolol (LOPRESSOR) 50 MG tablet Take 50 mg by mouth. 11/10/13   [provider]  Multiple Vitamins-Minerals (VISION-VITE PRESERVE PO) Take by mouth.    [provider]  ranitidine (ZANTAC) 150 MG tablet Take 150 mg by mouth.    [provider]  tapentadol (NUCYNTA) 50 MG TABS tablet Take 1 tablet (50 mg total) by mouth every 6 (six) hours as needed for moderate pain or severe pain. 08/18/15   Karie ChimeraBuchanan, Brian, PA-C      Allergies    Anesthetics, amide; Celecoxib; Codeine; Ibuprofen; Meperidine; Naproxen; Sulfamethoxazole-trimethoprim; Naloxone; Pentazocine; and Azithromycin    Review of Systems   Review of Systems  Constitutional:  Positive for fatigue. Negative for fever.  Respiratory:  Negative for shortness of breath.   Cardiovascular:  Negative for chest pain.  Gastrointestinal:  Positive for nausea. Negative for abdominal pain, diarrhea and vomiting.  Musculoskeletal:  Positive for back pain and neck pain. Negative for gait problem.  Skin:  Negative for rash.  Neurological:  Positive for headaches. Negative for syncope, weakness and numbness.   Physical Exam Updated Vital Signs BP (!) 161/83   Pulse 63   Resp 17   LMP  (LMP Unknown) Comment: pt. is 6887  SpO2 96%  Physical Exam Vitals and nursing note reviewed.  Constitutional:      General: She is not in acute distress.    Appearance: Normal appearance.  HENT:     Head: Normocephalic.     Mouth/Throat:     Mouth: Mucous membranes are moist.  Eyes:     Pupils: Pupils are equal, round, and reactive to light.  Neck:     Comments: Paraspinal musculature tenderness to palpation, no step-offs Cardiovascular:     Rate and Rhythm: Normal rate.  Pulmonary:     Effort: Pulmonary effort is normal. No respiratory distress.  Abdominal:     Palpations:  Abdomen is soft.     Tenderness: There is no abdominal tenderness.  Musculoskeletal:     Cervical back: No rigidity.     Comments: Diffuse tenderness to palpation along the spine, mainly paraspinal, mainly in the lumbar region  Skin:    General: Skin is warm.  Neurological:     General: No focal deficit present.     Mental Status: She is alert and oriented to person, place, and time. Mental status is at baseline.  Psychiatric:        Mood and Affect: Mood normal.    ED Results / Procedures / Treatments   Labs (all labs ordered are listed, but only abnormal results are displayed) Labs Reviewed  CBC - Abnormal; Notable for the following components:      Result Value   Hemoglobin 11.5 (*)    All other components within normal limits  COMPREHENSIVE METABOLIC PANEL - Abnormal; Notable for the following components:   Glucose, Bld 100 (*)    BUN 27 (*)    Creatinine, Ser 1.10 (*)    Calcium 8.5 (*)    Total Protein 6.2 (*)    GFR, Estimated 47 (*)    All other components within normal limits  URINALYSIS, ROUTINE W REFLEX MICROSCOPIC  TROPONIN I (HIGH SENSITIVITY)    EKG EKG Interpretation  Date/Time:  Thursday January 12 2022 14:09:13 EDT Ventricular Rate:  63 PR Interval:  155 QRS Duration: 129 QT Interval:  447 QTC Calculation: 458 R Axis:   -61 Text Interpretation: Sinus arrhythmia Ventricular premature complex RBBB and LAFB Left ventricular hypertrophy NSR, PVC, T wave inversion lead III, otherwise similar to previous Confirmed by Coralee Pesa [phone]) on 01/12/2022 3:14:25 PM  Radiology No results found.  Procedures Procedures    Medications Ordered in ED Medications - No data to display  ED Course/ Medical Decision Making/ A&P                           Medical Decision Making Amount and/or Complexity of Data Reviewed Labs: ordered. Radiology: ordered.   88 year old female presents to the emergency department with multiple falls.  Currently complaining of  mild head pain, cervical/thoracic/lumbar pain as well as interim episodes of nausea/chest pain for the past month.  There was reported transient episode of slurred speech after this fall.  No other focal deficit.  No other LOC/syncope.  Vitals are stable on arrival.  EKG is normal sinus rhythm with PVC, there is a T wave inversion in lead III which is new but otherwise her EKG is similar to previous.  She is chest pain-free.  Plan for metabolic evaluation including troponin and starting with CT of the head and cervical/thoracic/lumbar spine.  Depending on findings may proceed with MRI in regards to transient episode of slurred speech  to rule out TIA.  Otherwise the patient currently feels baseline and is comfortable. Patient signed out to Dr. Silverio Lay.        Final Clinical Impression(s) / ED Diagnoses Final diagnoses:  None    Rx / DC Orders ED Discharge Orders     None         Rozelle Logan, DO 01/12/22 1519

## 2022-03-24 ENCOUNTER — Other Ambulatory Visit: Payer: Self-pay | Admitting: Family Medicine

## 2022-03-24 DIAGNOSIS — Z1231 Encounter for screening mammogram for malignant neoplasm of breast: Secondary | ICD-10-CM

## 2022-04-26 ENCOUNTER — Ambulatory Visit
Admission: RE | Admit: 2022-04-26 | Discharge: 2022-04-26 | Disposition: A | Discharge status: Home / Self Care | Payer: Medicare HMO | Source: Ambulatory Visit | Attending: Family Medicine | Admitting: Family Medicine

## 2022-04-26 DIAGNOSIS — Z1231 Encounter for screening mammogram for malignant neoplasm of breast: Secondary | ICD-10-CM

## 2022-05-22 ENCOUNTER — Emergency Department (HOSPITAL_BASED_OUTPATIENT_CLINIC_OR_DEPARTMENT_OTHER)
Admission: EM | Admit: 2022-05-22 | Discharge: 2022-05-22 | Disposition: A | Discharge status: Home / Self Care | Payer: Medicare HMO | Attending: Emergency Medicine | Admitting: Emergency Medicine

## 2022-05-22 ENCOUNTER — Emergency Department (HOSPITAL_BASED_OUTPATIENT_CLINIC_OR_DEPARTMENT_OTHER): Payer: Medicare HMO | Admitting: Radiology

## 2022-05-22 ENCOUNTER — Other Ambulatory Visit: Payer: Self-pay

## 2022-05-22 ENCOUNTER — Emergency Department (HOSPITAL_BASED_OUTPATIENT_CLINIC_OR_DEPARTMENT_OTHER): Payer: Medicare HMO

## 2022-05-22 ENCOUNTER — Encounter (HOSPITAL_BASED_OUTPATIENT_CLINIC_OR_DEPARTMENT_OTHER): Payer: Self-pay | Admitting: *Deleted

## 2022-05-22 DIAGNOSIS — S6991XA Unspecified injury of right wrist, hand and finger(s), initial encounter: Secondary | ICD-10-CM | POA: Diagnosis present

## 2022-05-22 DIAGNOSIS — I10 Essential (primary) hypertension: Secondary | ICD-10-CM | POA: Diagnosis not present

## 2022-05-22 DIAGNOSIS — Z79899 Other long term (current) drug therapy: Secondary | ICD-10-CM | POA: Insufficient documentation

## 2022-05-22 DIAGNOSIS — R519 Headache, unspecified: Secondary | ICD-10-CM | POA: Insufficient documentation

## 2022-05-22 DIAGNOSIS — W01198A Fall on same level from slipping, tripping and stumbling with subsequent striking against other object, initial encounter: Secondary | ICD-10-CM | POA: Insufficient documentation

## 2022-05-22 DIAGNOSIS — S61421A Laceration with foreign body of right hand, initial encounter: Secondary | ICD-10-CM | POA: Diagnosis not present

## 2022-05-22 DIAGNOSIS — W19XXXA Unspecified fall, initial encounter: Secondary | ICD-10-CM

## 2022-05-22 DIAGNOSIS — M7918 Myalgia, other site: Secondary | ICD-10-CM

## 2022-05-22 MED ORDER — LIDOCAINE-EPINEPHRINE-TETRACAINE (LET) TOPICAL GEL
3.0000 mL | Freq: Once | TOPICAL | Status: DC
  Filled 2022-05-22: qty 3

## 2022-05-22 MED ORDER — BACITRACIN ZINC 500 UNIT/GM EX OINT
TOPICAL_OINTMENT | Freq: Two times a day (BID) | CUTANEOUS | Status: DC
  Filled 2022-05-22: qty 28.35

## 2022-05-22 NOTE — ED Notes (Signed)
Discharge paperwork given and verbally understood. 

## 2022-05-22 NOTE — ED Provider Notes (Signed)
[facility] EMERGENCY DEPT Provider Note   CSN: [id number] Arrival date & time: 05/22/22  1207     History  Chief Complaint  Patient presents with   [facility] Pamela Braun is a 88 y.o. female.  88 year old female with past medical history of hypertension, rosacea, thyroid disease and GERD brought in by son after a fall today.  Patient states that she was trying to move a dog out of the way when she fell forward, hit her head on the door as well as injured her right hand resulting in minor wounds to the right hand.  Also reports pain in her right shoulder and left elbow.  Patient is not anticoagulated, denies loss of consciousness, neck or back pain.  Patient has been ambulatory since the fall without difficulty.  After the fall, she did push her call bell and her son was able to get to the house to help bring her here today.  No other injuries, complaints, concerns. Last tetanus on file from 2022       Home Medications Prior to Admission medications   Medication Sig Start Date End Date Taking? Authorizing Provider  acetaminophen (TYLENOL) 500 MG tablet Take 500 mg by mouth every 6 (six) hours as needed for mild pain.     [provider]  aspirin 81 MG chewable tablet Chew 81 mg by mouth.    [provider]  Calcium-Vitamin D-Vitamin K 500-500-40 MG-UNT-MCG CHEW Chew 500 mg by mouth.    [provider]  cephALEXin (KEFLEX) 500 MG capsule Take 1 capsule (500 mg total) by mouth 4 (four) times daily. 08/18/15   Karie Chimera, PA-C  diclofenac sodium (VOLTAREN) 1 % GEL Place 2 g onto the skin daily as needed. For leg pain per patient 08/25/13   [provider]  levothyroxine (SYNTHROID, LEVOTHROID) 125 MCG tablet Take 125 mcg by mouth daily before breakfast.    [provider]  metoprolol (LOPRESSOR) 50 MG tablet Take 50 mg by mouth. 11/10/13   [provider]  Multiple Vitamins-Minerals (VISION-VITE PRESERVE PO)  Take by mouth.    [provider]  ranitidine (ZANTAC) 150 MG tablet Take 150 mg by mouth.    [provider]  tapentadol (NUCYNTA) 50 MG TABS tablet Take 1 tablet (50 mg total) by mouth every 6 (six) hours as needed for moderate pain or severe pain. 08/18/15   Karie Chimera, PA-C      Allergies    Anesthetics, amide; Celecoxib; Codeine; Ibuprofen; Meperidine; Naproxen; Sulfamethoxazole-trimethoprim; Naloxone; Pentazocine; and Azithromycin    Review of Systems   Review of Systems Negative except as per HPI Physical Exam Updated Vital Signs BP (!) 197/78 (BP Location: Left Arm)   Pulse 63   Temp 98.9 F (37.2 C) (Oral)   Resp 16   LMP  (LMP Unknown) Comment: pt. is 87   SpO2 98%  Physical Exam Vitals and nursing note reviewed.  Constitutional:      General: She is not in acute distress.    Appearance: She is well-developed. She is not diaphoretic.  HENT:     Head: Normocephalic and atraumatic.  Eyes:     Extraocular Movements: Extraocular movements intact.     Pupils: Pupils are equal, round, and reactive to light.  Cardiovascular:     Pulses: Normal pulses.  Pulmonary:     Effort: Pulmonary effort is normal.  Abdominal:     Palpations: Abdomen is soft.     Tenderness: There is  no abdominal tenderness.  Musculoskeletal:        General: Tenderness and signs of injury present. No swelling or deformity.     Right elbow: Normal.     Left elbow: Normal.     Right wrist: Normal.     Left wrist: Normal.     Cervical back: Normal range of motion and neck supple. No tenderness or bony tenderness.     Thoracic back: No tenderness or bony tenderness.     Lumbar back: No tenderness or bony tenderness.     Right knee: Normal.     Left knee: Normal.     Right foot: Normal.     Left foot: Normal.     Comments: Minor laceration to palm of right hand involving the base of the right thumb as well as second wound to the base of the index finger.  No active  bleeding. No pain with logroll of hips  Skin:    General: Skin is warm and dry.     Findings: No bruising, erythema or rash.  Neurological:     Mental Status: She is alert and oriented to person, place, and time.     Sensory: No sensory deficit.     Motor: No weakness.     Gait: Gait normal.  Psychiatric:        Behavior: Behavior normal.     ED Results / Procedures / Treatments   Labs (all labs ordered are listed, but only abnormal results are displayed) Labs Reviewed - No data to display  EKG None  Radiology CT Head Wo Contrast  Result Date: 05/22/2022 CLINICAL DATA:  Provided history: Head trauma, minor. Neck trauma. Additional history provided: Fall (hitting head). EXAM: CT HEAD WITHOUT CONTRAST CT CERVICAL SPINE WITHOUT CONTRAST TECHNIQUE: Multidetector CT imaging of the head and cervical spine was performed following the standard protocol without intravenous contrast. Multiplanar CT image reconstructions of the cervical spine were also generated. RADIATION DOSE REDUCTION: This exam was performed according to the departmental dose-optimization program which includes automated exposure control, adjustment of the mA and/or kV according to patient size and/or use of iterative reconstruction technique. COMPARISON:  Brain MRI 01/12/2022. Head CT 01/12/2022. Cervical spine CT 01/12/2022. FINDINGS: CT HEAD FINDINGS Brain: Mild cerebral atrophy. Redemonstrated chronic lacunar infarct within the right lentiform nucleus/retrolenticular white matter. Mild patchy and ill-defined hypoattenuation elsewhere within the cerebral white matter, nonspecific but compatible with chronic small vessel ischemic disease. Small nonspecific focus of calcification within the left occipital lobe white matter, chronic and unchanged. There is no acute intracranial hemorrhage. No demarcated cortical infarct. No extra-axial fluid collection. No evidence of an intracranial mass. No midline shift. Vascular: No hyperdense  vessel. Atherosclerotic calcifications. Skull: No fracture or aggressive osseous lesion. Sinuses/Orbits: No mass or acute finding within the imaged orbits. Minimal mucosal thickening within the anterior ethmoid air cells, bilaterally. CT CERVICAL SPINE FINDINGS Alignment: Slight C3-C4 grade 1 retrolisthesis. Slight C4-C5 grade 1 anterolisthesis. Slight C7-T1 grade 1 anterolisthesis. Skull base and vertebrae: The basion-dental and atlanto-dental intervals are maintained.No evidence of acute fracture to the cervical spine. Soft tissues and spinal canal: No prevertebral fluid or swelling. No visible canal hematoma. Disc levels: Cervical spondylosis with multilevel disc space narrowing, disc bulges, uncovertebral hypertrophy and facet arthrosis. Disc space narrowing is greatest at C3-C4 and C6-C7 (advanced at these levels). No appreciable high-grade spinal canal stenosis. Multilevel bony neural foraminal narrowing. Facet joint ankylosis on the left at C6-C7. Upper chest: No consolidation within the imaged lung  apices. No visible pneumothorax. IMPRESSION: CT head: 1. No evidence of acute intracranial abnormality. 2. Mild chronic small vessel ischemic changes within the cerebral white matter. 3. Redemonstrated chronic lacunar infarct within the right lentiform nucleus/retrolenticular white maater. 4. Mild generalized cerebral atrophy. CT cervical spine: 1. No evidence of acute fracture to the cervical spine. 2. Mild grade 1 spondylolisthesis at C3-C4 and C4-C5. 3. Cervical spondylosis, as described. 4. Facet joint ankylosis on the left at C6-C7. Electronically Signed   By: Jackey Loge D.O.   On: 05/22/2022 13:46   CT Cervical Spine Wo Contrast  Result Date: 05/22/2022 CLINICAL DATA:  Provided history: Head trauma, minor. Neck trauma. Additional history provided: Fall (hitting head). EXAM: CT HEAD WITHOUT CONTRAST CT CERVICAL SPINE WITHOUT CONTRAST TECHNIQUE: Multidetector CT imaging of the head and cervical spine was  performed following the standard protocol without intravenous contrast. Multiplanar CT image reconstructions of the cervical spine were also generated. RADIATION DOSE REDUCTION: This exam was performed according to the departmental dose-optimization program which includes automated exposure control, adjustment of the mA and/or kV according to patient size and/or use of iterative reconstruction technique. COMPARISON:  Brain MRI 01/12/2022. Head CT 01/12/2022. Cervical spine CT 01/12/2022. FINDINGS: CT HEAD FINDINGS Brain: Mild cerebral atrophy. Redemonstrated chronic lacunar infarct within the right lentiform nucleus/retrolenticular white matter. Mild patchy and ill-defined hypoattenuation elsewhere within the cerebral white matter, nonspecific but compatible with chronic small vessel ischemic disease. Small nonspecific focus of calcification within the left occipital lobe white matter, chronic and unchanged. There is no acute intracranial hemorrhage. No demarcated cortical infarct. No extra-axial fluid collection. No evidence of an intracranial mass. No midline shift. Vascular: No hyperdense vessel. Atherosclerotic calcifications. Skull: No fracture or aggressive osseous lesion. Sinuses/Orbits: No mass or acute finding within the imaged orbits. Minimal mucosal thickening within the anterior ethmoid air cells, bilaterally. CT CERVICAL SPINE FINDINGS Alignment: Slight C3-C4 grade 1 retrolisthesis. Slight C4-C5 grade 1 anterolisthesis. Slight C7-T1 grade 1 anterolisthesis. Skull base and vertebrae: The basion-dental and atlanto-dental intervals are maintained.No evidence of acute fracture to the cervical spine. Soft tissues and spinal canal: No prevertebral fluid or swelling. No visible canal hematoma. Disc levels: Cervical spondylosis with multilevel disc space narrowing, disc bulges, uncovertebral hypertrophy and facet arthrosis. Disc space narrowing is greatest at C3-C4 and C6-C7 (advanced at these levels). No  appreciable high-grade spinal canal stenosis. Multilevel bony neural foraminal narrowing. Facet joint ankylosis on the left at C6-C7. Upper chest: No consolidation within the imaged lung apices. No visible pneumothorax. IMPRESSION: CT head: 1. No evidence of acute intracranial abnormality. 2. Mild chronic small vessel ischemic changes within the cerebral white matter. 3. Redemonstrated chronic lacunar infarct within the right lentiform nucleus/retrolenticular white maater. 4. Mild generalized cerebral atrophy. CT cervical spine: 1. No evidence of acute fracture to the cervical spine. 2. Mild grade 1 spondylolisthesis at C3-C4 and C4-C5. 3. Cervical spondylosis, as described. 4. Facet joint ankylosis on the left at C6-C7. Electronically Signed   By: Jackey Loge D.O.   On: 05/22/2022 13:46   DG Shoulder Right  Result Date: 05/22/2022 CLINICAL DATA:  Fall, pain EXAM: RIGHT SHOULDER - 2+ VIEW COMPARISON:  None Available. FINDINGS: Status post right shoulder arthroplasty. High riding position of the right humeral head. No evidence of perihardware fracture or component malpositioning. Severe acromioclavicular arthrosis. Partially imaged right chest is unremarkable. IMPRESSION: 1. Status post right shoulder arthroplasty. 2. No evidence of perihardware fracture or component malpositioning. 3. Severe acromioclavicular arthrosis. Electronically Signed   By:  Delanna Ahmadi M.D.   On: 05/22/2022 13:29   DG Knee Complete 4 Views Left  Result Date: 05/22/2022 CLINICAL DATA:  Status post fall EXAM: LEFT KNEE - COMPLETE 4+ VIEW COMPARISON:  08/16/2015 FINDINGS: No joint effusion. Chondrocalcinosis. Severe medial compartment joint space narrowing with exuberant marginal spur formation identified. Mild patellofemoral and lateral compartment degenerative change noted also. No signs of acute fracture or dislocation. Atherosclerotic calcifications identified. IMPRESSION: 1. No acute findings. 2. Severe medial compartment  osteoarthritis. 3. Chondrocalcinosis. Electronically Signed   By: Kerby Moors M.D.   On: 05/22/2022 13:28   DG Hand Complete Right  Result Date: 05/22/2022 CLINICAL DATA:  Trauma, fall, pain EXAM: RIGHT HAND - COMPLETE 3+ VIEW COMPARISON:  None Available. FINDINGS: No recent fracture or dislocation is seen. Severe degenerative changes are noted in first carpometacarpal joint. Degenerative changes are noted in multiple interphalangeal joints, more prominent in the thumb and in the distal interphalangeal joint of middle finger. Degenerative changes are noted in the radiocarpal joint with joint space narrowing, bony spurs and chondrocalcinosis. Degenerative changes are noted in the intercarpal joints along the lateral aspect of the wrist. Vascular calcifications are seen in soft tissues. IMPRESSION: No recent fracture or dislocation is seen. Degenerative changes are noted in multiple joints as described in the body of the report. Electronically Signed   By: Elmer Picker M.D.   On: 05/22/2022 13:26   DG Elbow Complete Left  Result Date: 05/22/2022 CLINICAL DATA:  Fall, pain EXAM: LEFT ELBOW - COMPLETE 3+ VIEW COMPARISON:  05/29/2021 FINDINGS: Examination is limited by very rotated lateral view provided for review. Within this limitation, no displaced fracture or dislocation of the left elbow and no obvious elbow joint effusion. Joint spaces are preserved. Soft tissues unremarkable. IMPRESSION: Examination is limited by very rotated lateral view provided for review. Within this limitation, no displaced fracture or dislocation of the left elbow and no obvious elbow joint effusion. Electronically Signed   By: Delanna Ahmadi M.D.   On: 05/22/2022 13:26    Procedures Procedures    Medications Ordered in ED Medications  bacitracin ointment ( Topical Given by Other 05/22/22 1440)    ED Course/ Medical Decision Making/ A&P                           Medical Decision Making Amount and/or Complexity  of Data Reviewed Radiology: ordered.  Risk OTC drugs.   88 year old female presents after mechanical fall today.  CT head and C-spine obtained after patient hit her head, not anticoagulated, read by radiology is negative for acute findings.  X-ray of the right knee, left elbow, right hand, right shoulder as ordered and interpreted by myself reveal no acute bony abnormalities, agree with radiologist interpretation.  Right hand wound was cleaned, it was contaminated with animal hair and dirt, thoroughly irrigated with saline, all visible dirt removed, wound debrided and dressed with bacitracin nonstick bandage and compressive dressing. Recommend recheck with PCP this week.         Final Clinical Impression(s) / ED Diagnoses Final diagnoses:  Fall, initial encounter  Musculoskeletal pain  Laceration of right hand with foreign body, initial encounter    Rx / DC Orders ED Discharge Orders     None         Tacy Learn, PA-C 05/22/22 1540    Audley Hose, MD 05/23/22 2112

## 2022-05-22 NOTE — Discharge Instructions (Signed)
Recheck with your doctor in 2 days.  Clean hand wound with warm soapy water.  Apply bacitracin with a clean dressing twice daily.

## 2022-05-22 NOTE — ED Triage Notes (Signed)
Pt arrives here from home where she lives with family.  Pt fell at home and hit her head (no head or neck pain, no blood thinners) and pt has two skin tears on her right hand.  Tetanus up to date.

## 2023-01-09 ENCOUNTER — Emergency Department (HOSPITAL_BASED_OUTPATIENT_CLINIC_OR_DEPARTMENT_OTHER)
Admission: EM | Admit: 2023-01-09 | Discharge: 2023-01-09 | Disposition: A | Discharge status: Home / Self Care | Payer: Medicare HMO | Attending: Emergency Medicine | Admitting: Emergency Medicine

## 2023-01-09 ENCOUNTER — Other Ambulatory Visit: Payer: Self-pay

## 2023-01-09 ENCOUNTER — Emergency Department (HOSPITAL_BASED_OUTPATIENT_CLINIC_OR_DEPARTMENT_OTHER): Payer: Medicare HMO

## 2023-01-09 ENCOUNTER — Encounter (HOSPITAL_BASED_OUTPATIENT_CLINIC_OR_DEPARTMENT_OTHER): Payer: Self-pay

## 2023-01-09 DIAGNOSIS — Y92512 Supermarket, store or market as the place of occurrence of the external cause: Secondary | ICD-10-CM | POA: Diagnosis not present

## 2023-01-09 DIAGNOSIS — R519 Headache, unspecified: Secondary | ICD-10-CM | POA: Diagnosis present

## 2023-01-09 DIAGNOSIS — W01198A Fall on same level from slipping, tripping and stumbling with subsequent striking against other object, initial encounter: Secondary | ICD-10-CM | POA: Diagnosis not present

## 2023-01-09 DIAGNOSIS — Z7982 Long term (current) use of aspirin: Secondary | ICD-10-CM | POA: Insufficient documentation

## 2023-01-09 DIAGNOSIS — Z79899 Other long term (current) drug therapy: Secondary | ICD-10-CM | POA: Diagnosis not present

## 2023-01-09 DIAGNOSIS — M542 Cervicalgia: Secondary | ICD-10-CM | POA: Diagnosis not present

## 2023-01-09 DIAGNOSIS — S0003XA Contusion of scalp, initial encounter: Secondary | ICD-10-CM | POA: Insufficient documentation

## 2023-01-09 DIAGNOSIS — I10 Essential (primary) hypertension: Secondary | ICD-10-CM | POA: Diagnosis not present

## 2023-01-09 DIAGNOSIS — W19XXXA Unspecified fall, initial encounter: Secondary | ICD-10-CM

## 2023-01-09 NOTE — ED Notes (Signed)
Patient transported to CT 

## 2023-01-09 NOTE — Discharge Instructions (Addendum)
You were evaluated today after a fall.  Your CT scans of your head and neck were reassuring with no acute findings.  Please follow-up with me with your primary care provider.  If you develop any life-threatening symptoms please return to the emergency department.

## 2023-01-09 NOTE — ED Provider Notes (Signed)
[place] EMERGENCY DEPARTMENT AT [facility] Provider Note   CSN: [id number] Arrival date & time: 01/09/23  1720     History  Chief Complaint  Patient presents with   [facility] Pamela Braun is a 89 y.o. female.  Patient presents to the emergency room complaining of pain in the posterior scalp and neck secondary to a mechanical fall.  Patient states she was stepping up on a curb going into a store and tripped, falling backwards and hitting her head on the pavement.  She denies losing consciousness and denies using blood thinners.  Past medical history significant for hypertension, sciatica, vertigo  HPI     Home Medications Prior to Admission medications   Medication Sig Start Date End Date Taking? Authorizing Provider  acetaminophen (TYLENOL) 500 MG tablet Take 500 mg by mouth every 6 (six) hours as needed for mild pain.     [provider]  aspirin 81 MG chewable tablet Chew 81 mg by mouth.    [provider]  Calcium-Vitamin D-Vitamin K 500-500-40 MG-UNT-MCG CHEW Chew 500 mg by mouth.    [provider]  cephALEXin (KEFLEX) 500 MG capsule Take 1 capsule (500 mg total) by mouth 4 (four) times daily. 08/18/15   Karie Chimera, PA-C  diclofenac sodium (VOLTAREN) 1 % GEL Place 2 g onto the skin daily as needed. For leg pain per patient 08/25/13   [provider]  levothyroxine (SYNTHROID, LEVOTHROID) 125 MCG tablet Take 125 mcg by mouth daily before breakfast.    [provider]  metoprolol (LOPRESSOR) 50 MG tablet Take 50 mg by mouth. 11/10/13   [provider]  Multiple Vitamins-Minerals (VISION-VITE PRESERVE PO) Take by mouth.    [provider]  ranitidine (ZANTAC) 150 MG tablet Take 150 mg by mouth.    [provider]  tapentadol (NUCYNTA) 50 MG TABS tablet Take 1 tablet (50 mg total) by mouth every 6 (six) hours as needed for moderate pain or severe pain. 08/18/15   Karie Chimera, PA-C       Allergies    Anesthetics, amide; Celecoxib; Codeine; Ibuprofen; Meperidine; Naproxen; Sulfamethoxazole-trimethoprim; Naloxone; Pentazocine; and Azithromycin    Review of Systems   Review of Systems  Physical Exam Updated Vital Signs BP (!) 191/94   Pulse 69   Temp (!) 97.3 F (36.3 C) (Temporal)   Resp 18   Ht 5\' 3"  (1.6 m)   Wt 72.1 kg   LMP  (LMP Unknown) Comment: pt. is 89   SpO2 98%   BMI 28.16 kg/m  Physical Exam Vitals and nursing note reviewed.  Constitutional:      General: She is not in acute distress.    Appearance: She is well-developed.  HENT:     Head: Normocephalic.     Comments: Palpable hematoma and posterior scalp on the left side.  No bleeding. Eyes:     Conjunctiva/sclera: Conjunctivae normal.     Pupils: Pupils are equal, round, and reactive to light.  Cardiovascular:     Rate and Rhythm: Normal rate and regular rhythm.     Heart sounds: No murmur heard. Pulmonary:     Effort: Pulmonary effort is normal. No respiratory distress.     Breath sounds: Normal breath sounds.  Abdominal:     Palpations: Abdomen is soft.     Tenderness: There is no abdominal tenderness.  Musculoskeletal:        General: No swelling.     Cervical back: Neck supple.  Tenderness (Mild midline cervical spine tenderness) present.  Skin:    General: Skin is warm and dry.     Capillary Refill: Capillary refill takes less than 2 seconds.  Neurological:     General: No focal deficit present.     Mental Status: She is alert.  Psychiatric:        Mood and Affect: Mood normal.     ED Results / Procedures / Treatments   Labs (all labs ordered are listed, but only abnormal results are displayed) Labs Reviewed - No data to display  EKG None  Radiology CT Head Wo Contrast  Result Date: 01/09/2023 CLINICAL DATA:  Trip and fall EXAM: CT HEAD WITHOUT CONTRAST CT CERVICAL SPINE WITHOUT CONTRAST TECHNIQUE: Multidetector CT imaging of the head and cervical spine was performed  following the standard protocol without intravenous contrast. Multiplanar CT image reconstructions of the cervical spine were also generated. RADIATION DOSE REDUCTION: This exam was performed according to the departmental dose-optimization program which includes automated exposure control, adjustment of the mA and/or kV according to patient size and/or use of iterative reconstruction technique. COMPARISON:  05/22/2022 FINDINGS: CT HEAD FINDINGS Brain: No evidence of acute infarction, hemorrhage, hydrocephalus, extra-axial collection or mass lesion/mass effect. Periventricular and deep white matter hypodensity. Vascular: No hyperdense vessel or unexpected calcification. Skull: Normal. Negative for fracture or focal lesion. Sinuses/Orbits: No acute finding. Other: Contusion of the left scalp vertex (series 2, image 24). CT CERVICAL SPINE FINDINGS Alignment: Normal. Skull base and vertebrae: No acute fracture. No primary bone lesion or focal pathologic process. Soft tissues and spinal canal: No prevertebral fluid or swelling. No visible canal hematoma. Disc levels: Moderate multilevel disc space height loss and osteophytosis Upper chest: Negative. Other: None. IMPRESSION: 1. No acute intracranial pathology. Small-vessel white matter disease. 2. Contusion of the left scalp vertex. 3. No fracture or static subluxation of the cervical spine. 4. Moderate multilevel cervical disc degenerative disease. Electronically Signed   By: Jearld Lesch M.D.   On: 01/09/2023 18:18   CT Cervical Spine Wo Contrast  Result Date: 01/09/2023 CLINICAL DATA:  Trip and fall EXAM: CT HEAD WITHOUT CONTRAST CT CERVICAL SPINE WITHOUT CONTRAST TECHNIQUE: Multidetector CT imaging of the head and cervical spine was performed following the standard protocol without intravenous contrast. Multiplanar CT image reconstructions of the cervical spine were also generated. RADIATION DOSE REDUCTION: This exam was performed according to the departmental  dose-optimization program which includes automated exposure control, adjustment of the mA and/or kV according to patient size and/or use of iterative reconstruction technique. COMPARISON:  05/22/2022 FINDINGS: CT HEAD FINDINGS Brain: No evidence of acute infarction, hemorrhage, hydrocephalus, extra-axial collection or mass lesion/mass effect. Periventricular and deep white matter hypodensity. Vascular: No hyperdense vessel or unexpected calcification. Skull: Normal. Negative for fracture or focal lesion. Sinuses/Orbits: No acute finding. Other: Contusion of the left scalp vertex (series 2, image 24). CT CERVICAL SPINE FINDINGS Alignment: Normal. Skull base and vertebrae: No acute fracture. No primary bone lesion or focal pathologic process. Soft tissues and spinal canal: No prevertebral fluid or swelling. No visible canal hematoma. Disc levels: Moderate multilevel disc space height loss and osteophytosis Upper chest: Negative. Other: None. IMPRESSION: 1. No acute intracranial pathology. Small-vessel white matter disease. 2. Contusion of the left scalp vertex. 3. No fracture or static subluxation of the cervical spine. 4. Moderate multilevel cervical disc degenerative disease. Electronically Signed   By: Jearld Lesch M.D.   On: 01/09/2023 18:18    Procedures Procedures  Medications Ordered in ED Medications - No data to display  ED Course/ Medical Decision Making/ A&P                             Medical Decision Making Amount and/or Complexity of Data Reviewed Radiology: ordered.   This patient presents to the ED for concern of injury secondary to a fall, this involves an extensive number of treatment options, and is a complaint that carries with it a high risk of complications and morbidity.  The differential diagnosis includes intracranial abnormality, fracture, dislocation, others   Co morbidities that complicate the patient evaluation  Vertigo   Additional history  obtained:  Additional history obtained from family at bedside   Imaging Studies ordered:  I ordered imaging studies including CT head and cervical spine I independently visualized and interpreted imaging which showed  1. No acute intracranial pathology. Small-vessel white matter  disease.  2. Contusion of the left scalp vertex.  3. No fracture or static subluxation of the cervical spine.  4. Moderate multilevel cervical disc degenerative disease.   I agree with the radiologist interpretation   Test / Admission - Considered:  No acute findings on imaging today.  Patient with a small hematoma to the posterior scalp.  No bleeding at this time.  No indication for further emergent workup or admission.  Patient to discharge home with recommendations for follow-up with primary care as needed.         Final Clinical Impression(s) / ED Diagnoses Final diagnoses:  Fall, initial encounter  Scalp hematoma, initial encounter    Rx / DC Orders ED Discharge Orders     None         Pamala Duffel 01/09/23 1837    Benjiman Core, MD 01/09/23 2332

## 2023-01-09 NOTE — ED Triage Notes (Signed)
Patient here POV from Home.  Endorses falling PTA today when she tripped over the Curb and fell backwards onto the Pavement. Pain to Posterior Head.   No Anticoagulants or LOC.   NAD noted during Triage. A&Ox4. GCS 15. BIB Wheelchair.

## 2023-02-22 ENCOUNTER — Emergency Department (HOSPITAL_BASED_OUTPATIENT_CLINIC_OR_DEPARTMENT_OTHER): Payer: Medicare HMO | Admitting: Radiology

## 2023-02-22 ENCOUNTER — Encounter (HOSPITAL_BASED_OUTPATIENT_CLINIC_OR_DEPARTMENT_OTHER): Payer: Self-pay | Admitting: Emergency Medicine

## 2023-02-22 ENCOUNTER — Emergency Department (HOSPITAL_BASED_OUTPATIENT_CLINIC_OR_DEPARTMENT_OTHER)
Admission: EM | Admit: 2023-02-22 | Discharge: 2023-02-22 | Disposition: A | Discharge status: Home / Self Care | Payer: Medicare HMO | Attending: Emergency Medicine | Admitting: Emergency Medicine

## 2023-02-22 ENCOUNTER — Emergency Department (HOSPITAL_BASED_OUTPATIENT_CLINIC_OR_DEPARTMENT_OTHER): Payer: Medicare HMO

## 2023-02-22 ENCOUNTER — Other Ambulatory Visit: Payer: Self-pay

## 2023-02-22 DIAGNOSIS — S0093XA Contusion of unspecified part of head, initial encounter: Secondary | ICD-10-CM

## 2023-02-22 DIAGNOSIS — Z79899 Other long term (current) drug therapy: Secondary | ICD-10-CM | POA: Insufficient documentation

## 2023-02-22 DIAGNOSIS — W01198A Fall on same level from slipping, tripping and stumbling with subsequent striking against other object, initial encounter: Secondary | ICD-10-CM | POA: Diagnosis not present

## 2023-02-22 DIAGNOSIS — S0081XA Abrasion of other part of head, initial encounter: Secondary | ICD-10-CM

## 2023-02-22 DIAGNOSIS — S60012A Contusion of left thumb without damage to nail, initial encounter: Secondary | ICD-10-CM | POA: Diagnosis not present

## 2023-02-22 DIAGNOSIS — I10 Essential (primary) hypertension: Secondary | ICD-10-CM | POA: Diagnosis not present

## 2023-02-22 DIAGNOSIS — Z7982 Long term (current) use of aspirin: Secondary | ICD-10-CM | POA: Diagnosis not present

## 2023-02-22 DIAGNOSIS — S62202A Unspecified fracture of first metacarpal bone, left hand, initial encounter for closed fracture: Secondary | ICD-10-CM | POA: Diagnosis not present

## 2023-02-22 DIAGNOSIS — S6992XA Unspecified injury of left wrist, hand and finger(s), initial encounter: Secondary | ICD-10-CM | POA: Diagnosis present

## 2023-02-22 DIAGNOSIS — R030 Elevated blood-pressure reading, without diagnosis of hypertension: Secondary | ICD-10-CM

## 2023-02-22 DIAGNOSIS — W19XXXA Unspecified fall, initial encounter: Secondary | ICD-10-CM

## 2023-02-22 DIAGNOSIS — Y92019 Unspecified place in single-family (private) house as the place of occurrence of the external cause: Secondary | ICD-10-CM | POA: Diagnosis not present

## 2023-02-22 LAB — CBC
HCT: 35.4 % — ABNORMAL LOW (ref 36.0–46.0)
Hemoglobin: 11.6 g/dL — ABNORMAL LOW (ref 12.0–15.0)
MCH: 28.7 pg (ref 26.0–34.0)
MCHC: 32.8 g/dL (ref 30.0–36.0)
MCV: 87.6 fL (ref 80.0–100.0)
Platelets: 127 10*3/uL — ABNORMAL LOW (ref 150–400)
RBC: 4.04 MIL/uL (ref 3.87–5.11)
RDW: 13.9 % (ref 11.5–15.5)
WBC: 6.3 10*3/uL (ref 4.0–10.5)
nRBC: 0 % (ref 0.0–0.2)

## 2023-02-22 LAB — BASIC METABOLIC PANEL
Anion gap: 8 (ref 5–15)
BUN: 27 mg/dL — ABNORMAL HIGH (ref 8–23)
CO2: 29 mmol/L (ref 22–32)
Calcium: 8.6 mg/dL — ABNORMAL LOW (ref 8.9–10.3)
Chloride: 103 mmol/L (ref 98–111)
Creatinine, Ser: 1.05 mg/dL — ABNORMAL HIGH (ref 0.44–1.00)
GFR, Estimated: 49 mL/min — ABNORMAL LOW (ref >60)
Glucose, Bld: 121 mg/dL — ABNORMAL HIGH (ref 70–99)
Potassium: 3.9 mmol/L (ref 3.5–5.1)
Sodium: 140 mmol/L (ref 135–145)

## 2023-02-22 LAB — URINALYSIS, ROUTINE W REFLEX MICROSCOPIC
Bacteria, UA: NONE SEEN
Bilirubin Urine: NEGATIVE
Glucose, UA: NEGATIVE mg/dL
Hgb urine dipstick: NEGATIVE
Ketones, ur: NEGATIVE mg/dL
Nitrite: NEGATIVE
Protein, ur: NEGATIVE mg/dL
Specific Gravity, Urine: 1.009 (ref 1.005–1.030)
pH: 5.5 (ref 5.0–8.0)

## 2023-02-22 MED ORDER — ACETAMINOPHEN 325 MG PO TABS
650.0000 mg | ORAL_TABLET | Freq: Once | ORAL | Status: AC
  Administered 2023-02-22: 650 mg via ORAL
  Filled 2023-02-22: qty 2

## 2023-02-22 NOTE — ED Provider Notes (Signed)
[place] EMERGENCY DEPARTMENT AT [facility] Provider Note   CSN: [id number] Arrival date & time: 02/22/23  1536     History  Chief Complaint  Patient presents with   [facility] Pamela Braun is a 89 y.o. female.  Pt s/p fall at home two days ago. Has walker, periodic/episodic falls at baseline, with trip and fall, hit head/face, and c/o pain to base of left thumb. No anticoagulant use. No faintness or dizziness prior to fall or since. No neck or back pain. No radicular pain. No numbness/weakness. Denies chest pain or sob. No abd pain or nvd. No dysuria, frequency or gu c/o. No fever or chills. Tetanus is up to date.   The history is provided by the patient, a relative and medical records.  Fall Pertinent negatives include no chest pain, no abdominal pain, no headaches and no shortness of breath.       Home Medications Prior to Admission medications   Medication Sig Start Date End Date Taking? Authorizing Provider  acetaminophen (TYLENOL) 500 MG tablet Take 500 mg by mouth every 6 (six) hours as needed for mild pain.     [provider]  aspirin 81 MG chewable tablet Chew 81 mg by mouth.    [provider]  Calcium-Vitamin D-Vitamin K 500-500-40 MG-UNT-MCG CHEW Chew 500 mg by mouth.    [provider]  cephALEXin (KEFLEX) 500 MG capsule Take 1 capsule (500 mg total) by mouth 4 (four) times daily. 08/18/15   Karie Chimera, PA-C  diclofenac sodium (VOLTAREN) 1 % GEL Place 2 g onto the skin daily as needed. For leg pain per patient 08/25/13   [provider]  levothyroxine (SYNTHROID, LEVOTHROID) 125 MCG tablet Take 125 mcg by mouth daily before breakfast.    [provider]  metoprolol (LOPRESSOR) 50 MG tablet Take 50 mg by mouth. 11/10/13   [provider]  Multiple Vitamins-Minerals (VISION-VITE PRESERVE PO) Take by mouth.    [provider]  ranitidine (ZANTAC) 150 MG tablet Take 150 mg by mouth.     [provider]  tapentadol (NUCYNTA) 50 MG TABS tablet Take 1 tablet (50 mg total) by mouth every 6 (six) hours as needed for moderate pain or severe pain. 08/18/15   Karie Chimera, PA-C      Allergies    Anesthetics, amide; Celecoxib; Codeine; Ibuprofen; Meperidine; Naproxen; Sulfamethoxazole-trimethoprim; Naloxone; Pentazocine; and Azithromycin    Review of Systems   Review of Systems  Constitutional:  Negative for chills and fever.  HENT:  Negative for nosebleeds.   Eyes:  Negative for pain.  Respiratory:  Negative for shortness of breath.   Cardiovascular:  Negative for chest pain.  Gastrointestinal:  Negative for abdominal pain, nausea and vomiting.  Genitourinary:  Negative for dysuria and flank pain.  Musculoskeletal:  Negative for back pain and neck pain.  Skin:  Negative for rash.  Neurological:  Negative for weakness, numbness and headaches.  Hematological:  Does not bruise/bleed easily.  Psychiatric/Behavioral:  Negative for confusion.     Physical Exam Updated Vital Signs BP (!) 198/66   Pulse 73   Temp 98.4 F (36.9 C) (Oral)   Resp 18   Ht 1.6 m (5\' 3" )   Wt 66.2 kg   LMP  (LMP Unknown) Comment: pt. is 89   SpO2 100%   BMI 25.86 kg/m  Physical Exam Vitals and nursing note reviewed.  Constitutional:      Appearance: Normal appearance. She is well-developed.  HENT:     Head: Atraumatic.     Comments: Contusion/bruising to right forehead/scalp/face. Superficial abrasion to forehead without sign of infection.     Nose: Nose normal.     Mouth/Throat:     Mouth: Mucous membranes are moist.  Eyes:     General: No scleral icterus.    Conjunctiva/sclera: Conjunctivae normal.     Pupils: Pupils are equal, round, and reactive to light.  Neck:     Vascular: No carotid bruit.     Trachea: No tracheal deviation.  Cardiovascular:     Rate and Rhythm: Normal rate and regular rhythm.     Pulses: Normal pulses.     Heart sounds: Normal heart sounds. No  murmur heard.    No friction rub. No gallop.  Pulmonary:     Effort: Pulmonary effort is normal. No respiratory distress.     Breath sounds: Normal breath sounds.  Chest:     Chest wall: No tenderness.  Abdominal:     General: Bowel sounds are normal. There is no distension.     Palpations: Abdomen is soft. There is no mass.     Tenderness: There is no abdominal tenderness. There is no guarding.  Genitourinary:    Comments: No cva tenderness.  Musculoskeletal:        General: No swelling.     Cervical back: Normal range of motion and neck supple. No rigidity or tenderness. No muscular tenderness.     Comments: CTLS spine, non tender, aligned, no step off. Tenderness base of left thumb, skin intact. Normal cap refill in thumb, normal tendon fxn. Otherwise good rom bil extremities without pain or focal bony tenderness.   Skin:    General: Skin is warm and dry.     Findings: No rash.  Neurological:     Mental Status: She is alert.     Comments: Alert, speech normal. Gcs 15. Motor/sens grossly intact bil.   Psychiatric:        Mood and Affect: Mood normal.     ED Results / Procedures / Treatments   Labs (all labs ordered are listed, but only abnormal results are displayed) Results for orders placed or performed during the hospital encounter of 02/22/23  Basic metabolic panel  Result Value Ref Range   Sodium 140 135 - 145 mmol/L   Potassium 3.9 3.5 - 5.1 mmol/L   Chloride 103 98 - 111 mmol/L   CO2 29 22 - 32 mmol/L   Glucose, Bld 121 (H) 70 - 99 mg/dL   BUN 27 (H) 8 - 23 mg/dL   Creatinine, Ser 1.61 (H) 0.44 - 1.00 mg/dL   Calcium 8.6 (L) 8.9 - 10.3 mg/dL   GFR, Estimated 49 (L) >60 mL/min   Anion gap 8 5 - 15  CBC  Result Value Ref Range   WBC 6.3 4.0 - 10.5 K/uL   RBC 4.04 3.87 - 5.11 MIL/uL   Hemoglobin 11.6 (L) 12.0 - 15.0 g/dL   HCT 09.6 (L) 04.5 - 40.9 %   MCV 87.6 80.0 - 100.0 fL   MCH 28.7 26.0 - 34.0 pg   MCHC 32.8 30.0 - 36.0 g/dL   RDW 81.1 91.4 - 78.2 %    Platelets 127 (L) 150 - 400 K/uL   nRBC 0.0 0.0 - 0.2 %  Urinalysis, Routine w reflex microscopic -Urine, Unspecified Source  Result Value Ref Range   Color, Urine YELLOW YELLOW   APPearance CLEAR CLEAR   Specific Gravity, Urine 1.009  1.005 - 1.030   pH 5.5 5.0 - 8.0   Glucose, UA NEGATIVE NEGATIVE mg/dL   Hgb urine dipstick NEGATIVE NEGATIVE   Bilirubin Urine NEGATIVE NEGATIVE   Ketones, ur NEGATIVE NEGATIVE mg/dL   Protein, ur NEGATIVE NEGATIVE mg/dL   Nitrite NEGATIVE NEGATIVE   Leukocytes,Ua MODERATE (A) NEGATIVE   RBC / HPF 0-5 0 - 5 RBC/hpf   WBC, UA 11-20 0 - 5 WBC/hpf   Bacteria, UA NONE SEEN NONE SEEN   Squamous Epithelial / HPF 0-5 0 - 5 /HPF   Hyaline Casts, UA PRESENT    DG Hand Complete Left  Result Date: 02/22/2023 CLINICAL DATA:  Fall, left hand pain and swelling EXAM: LEFT HAND - COMPLETE 3+ VIEW COMPARISON:  None Available. FINDINGS: Acute-appearing comminuted fracture of the first metacarpal diaphysis. Mild angulation although no significant displacement. No additional fractures are identified. Osteoarthritic changes are most pronounced at the first [facility] joint and within the interphalangeal joints of the fingers. Prior distal radial ORIF with intact hardware. Soft tissue swelling of the hand. Advanced atherosclerotic vascular calcification. IMPRESSION: Acute-appearing comminuted fracture of the first metacarpal diaphysis. Electronically Signed   By: Duanne Guess D.O.   On: 02/22/2023 17:03   CT HEAD WO CONTRAST  Result Date: 02/22/2023 CLINICAL DATA:  Head trauma, moderate-severe.  Unwitnessed fall. EXAM: CT HEAD WITHOUT CONTRAST TECHNIQUE: Contiguous axial images were obtained from the base of the skull through the vertex without intravenous contrast. RADIATION DOSE REDUCTION: This exam was performed according to the departmental dose-optimization program which includes automated exposure control, adjustment of the mA and/or kV according to patient size and/or use of  iterative reconstruction technique. COMPARISON:  Head CT 01/09/2023. FINDINGS: Brain: No acute hemorrhage. Unchanged mild chronic small-vessel disease and dystrophic calcification along the left occipital horn. Cortical gray-white differentiation is otherwise preserved. Prominence of the ventricles and sulci within expected range for age. No hydrocephalus or extra-axial collection. No mass effect or midline shift. Vascular: No hyperdense vessel or unexpected calcification. Skull: No calvarial fracture or suspicious bone lesion. Skull base is unremarkable. Sinuses/Orbits: Unremarkable. Other: None. IMPRESSION: 1. No acute intracranial abnormality or calvarial fracture. 2. Unchanged mild chronic small-vessel disease. Electronically Signed   By: Orvan Falconer M.D.   On: 02/22/2023 16:54    EKG EKG Interpretation Date/Time:  Thursday February 22 2023 16:15:05 EDT Ventricular Rate:  70 PR Interval:  142 QRS Duration:  118 QT Interval:  442 QTC Calculation: 477 R Axis:   -65  Text Interpretation: Normal sinus rhythm with sinus arrhythmia Right bundle branch block Left anterior fascicular block Bifascicular block Confirmed by Cathren Laine ([id number]) on 02/22/2023 7:09:03 PM  Radiology DG Hand Complete Left  Result Date: 02/22/2023 CLINICAL DATA:  Fall, left hand pain and swelling EXAM: LEFT HAND - COMPLETE 3+ VIEW COMPARISON:  None Available. FINDINGS: Acute-appearing comminuted fracture of the first metacarpal diaphysis. Mild angulation although no significant displacement. No additional fractures are identified. Osteoarthritic changes are most pronounced at the first [facility] joint and within the interphalangeal joints of the fingers. Prior distal radial ORIF with intact hardware. Soft tissue swelling of the hand. Advanced atherosclerotic vascular calcification. IMPRESSION: Acute-appearing comminuted fracture of the first metacarpal diaphysis. Electronically Signed   By: Duanne Guess D.O.   On: 02/22/2023  17:03   CT HEAD WO CONTRAST  Result Date: 02/22/2023 CLINICAL DATA:  Head trauma, moderate-severe.  Unwitnessed fall. EXAM: CT HEAD WITHOUT CONTRAST TECHNIQUE: Contiguous axial images were obtained from the base of the skull through  the vertex without intravenous contrast. RADIATION DOSE REDUCTION: This exam was performed according to the departmental dose-optimization program which includes automated exposure control, adjustment of the mA and/or kV according to patient size and/or use of iterative reconstruction technique. COMPARISON:  Head CT 01/09/2023. FINDINGS: Brain: No acute hemorrhage. Unchanged mild chronic small-vessel disease and dystrophic calcification along the left occipital horn. Cortical gray-white differentiation is otherwise preserved. Prominence of the ventricles and sulci within expected range for age. No hydrocephalus or extra-axial collection. No mass effect or midline shift. Vascular: No hyperdense vessel or unexpected calcification. Skull: No calvarial fracture or suspicious bone lesion. Skull base is unremarkable. Sinuses/Orbits: Unremarkable. Other: None. IMPRESSION: 1. No acute intracranial abnormality or calvarial fracture. 2. Unchanged mild chronic small-vessel disease. Electronically Signed   By: Orvan Falconer M.D.   On: 02/22/2023 16:54    Procedures Procedures    Medications Ordered in ED Medications  acetaminophen (TYLENOL) tablet 650 mg (650 mg Oral Given 02/22/23 1927)    ED Course/ Medical Decision Making/ A&P                             Medical Decision Making Problems Addressed: Abrasion of forehead, initial encounter: acute illness or injury Accidental fall, initial encounter: acute illness or injury with systemic symptoms that poses a threat to life or bodily functions Contusion of head, initial encounter: acute illness or injury with systemic symptoms that poses a threat to life or bodily functions Contusion of left thumb without damage to nail,  initial encounter: acute illness or injury Elevated blood pressure reading: acute illness or injury Essential hypertension: chronic illness or injury with exacerbation, progression, or side effects of treatment that poses a threat to life or bodily functions Unspecified fracture of first metacarpal bone, left hand, initial encounter for closed fracture: acute illness or injury  Amount and/or Complexity of Data Reviewed Labs: ordered. Radiology: ordered.  Risk OTC drugs.   Iv ns. Continuous pulse ox and cardiac monitoring. Labs ordered/sent. Imaging ordered.   Differential diagnosis includes  head injury, hand fracture, anemia, etc. Dispo decision including potential need for admission considered - will get labs and imaging and reassess.   Reviewed nursing notes and prior charts for additional history. External reports reviewed. Additional history from: family.   Cardiac monitor: sinus rhythm, rate 74.  Labs reviewed/interpreted by me - wbc normal. Chem normal. Pt denies any gu symptoms. No fever/chills. UA Nitrite neg.   Xrays reviewed/interpreted by me - 1st MC fx. Thumb spica splint.   CT reviewed/interpreted by me - no hem.   Acetaminophen po.   Rec close pcp f/u.  Return precautions provided.          Final Clinical Impression(s) / ED Diagnoses Final diagnoses:  None    Rx / DC Orders ED Discharge Orders     None         Cathren Laine, MD 02/22/23 2010

## 2023-02-22 NOTE — ED Notes (Signed)
Pt discharged in stable condition. Pt and daughter expressed understanding about discharge instructions and to follow up with Ortho and to return to ER for any further concerns or complications. Pt ambulated with slow gait to w/c and brought out to pt daughter vehicle via w/c with her cane in hand, no apparent distress.

## 2023-02-22 NOTE — Discharge Instructions (Addendum)
It was our pleasure to provide your ER care today - we hope that you feel better.  Wear splint. Take acetaminophen as need. Follow up with orthopedic hand specialist in the coming week - call office tomorrow AM to arrange appointment.   Fall precautions - use cane or walker, aand/or assistance, nd great care/caution when up and about to help decrease risk of falling.   Also follow up closely with primary care doctor in the next 1-2 weeks - have your blood pressure rechecked then, as it is high today.  Return to ER if worse, new symptoms, fevers, weak/fainting, new/severe pain, chest pain, trouble breathing, or other concern.

## 2023-02-22 NOTE — ED Triage Notes (Signed)
Pt via pov from home with daughter after an unwitnessed fall the night before last. Pt reports that she hit her head, has laceration on forehead over right eye. Pt's eye is bruised (states she had "something black"  in her field of vision this morning for a couple of hours but that has resolved). Pt also has black and blue left hand and wrist. Pt denies use of blood thinners. Pt alert & oriented, nad noted.

## 2023-10-05 IMAGING — CT CT CERVICAL SPINE W/O CM
3 of 4 series · 11 of 33 positions shown, 13 images · IV contrast (agent unspecified)
Comparison: Friday May, 2021.

CLINICAL DATA: A [AGE] female presents following fall for
evaluation.

EXAM:
CT HEAD WITHOUT CONTRAST
CT CERVICAL SPINE WITHOUT CONTRAST
CT CHEST, ABDOMEN AND PELVIS WITH CONTRAST
TECHNIQUE: Contiguous axial images were obtained from the base of the skull
through the vertex without intravenous contrast.

[Series 8: sag bone · sagittal · 0.38mm/px · 5 of 82 slices shown, 6 images]
[im 28/82  bone]
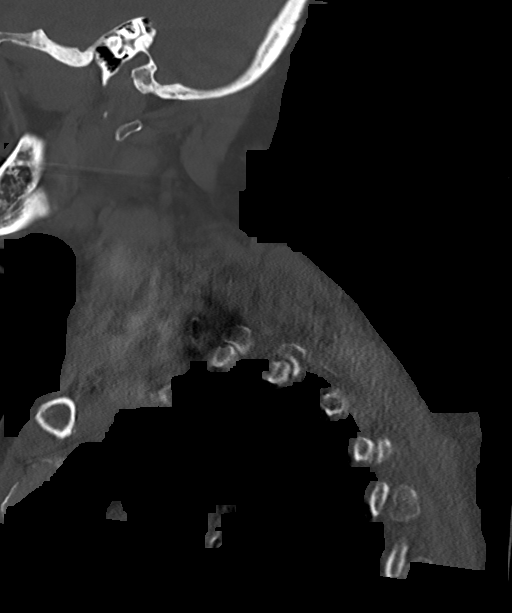
[im 34/82  bone]
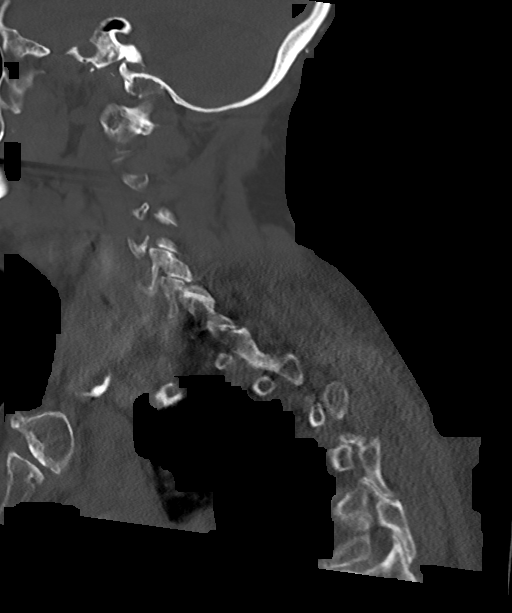
[im 41/82  soft-tissue]
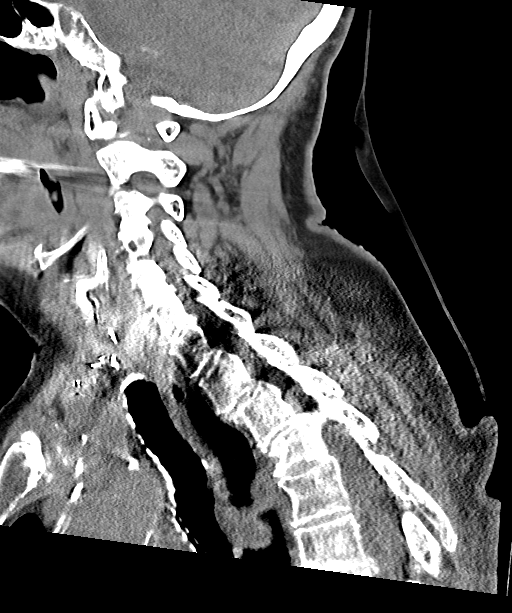
[im 41/82  bone]
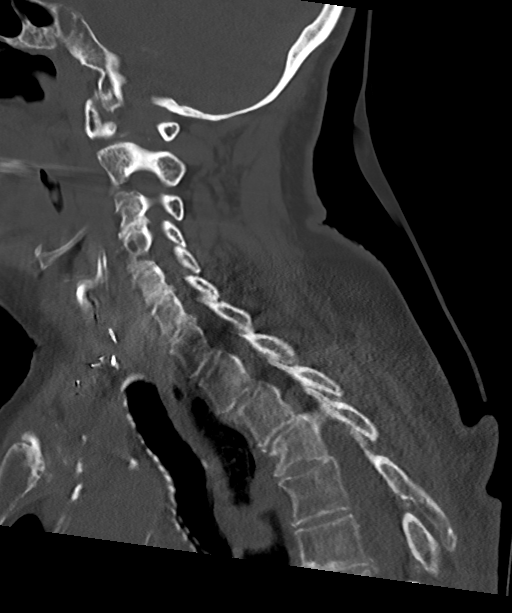
[im 48/82  bone]
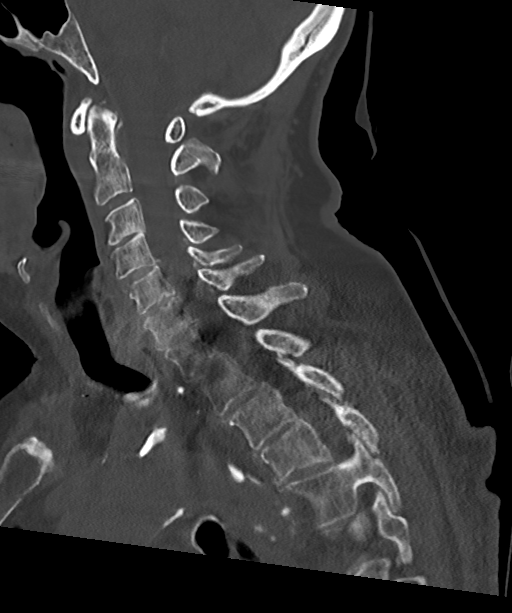
[im 55/82  bone]
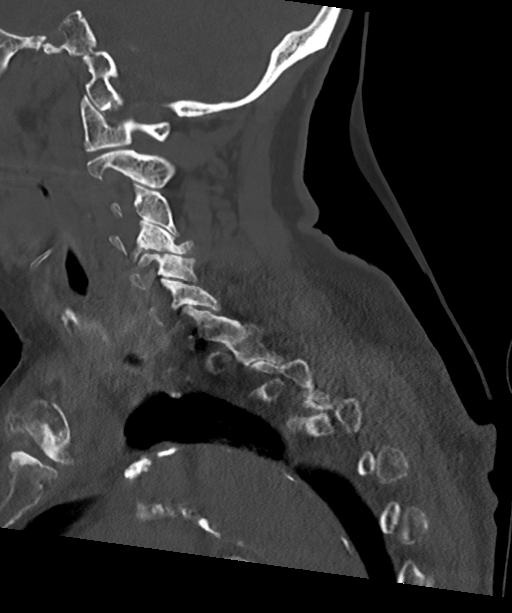

[Series 9: cor bone · coronal · 0.31mm/px · 3 of 93 slices shown]
[im 19/93  bone]
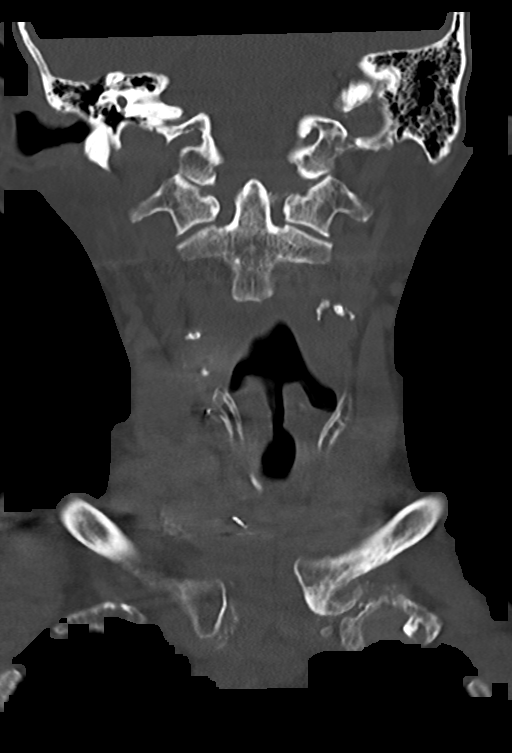
[im 37/93  bone]
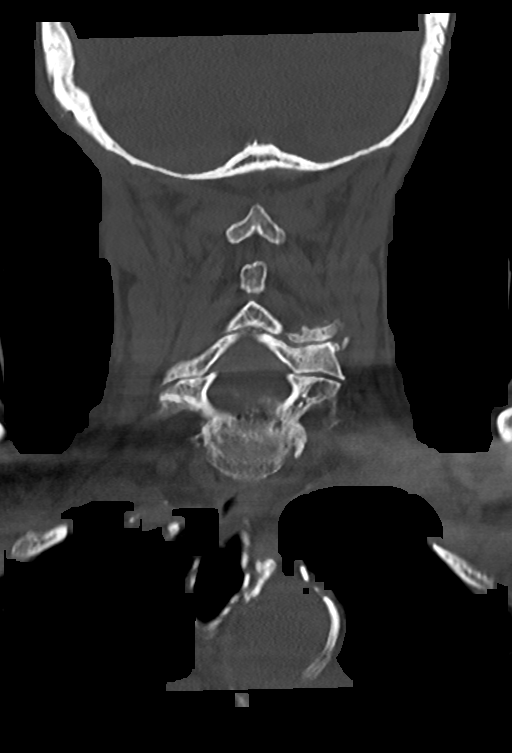
[im 56/93  bone]
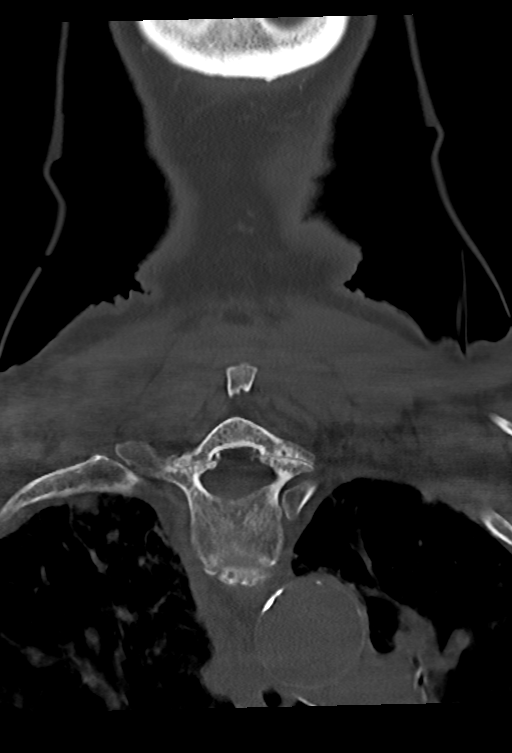

[Series 10: orthogonal axials · axial · 0.21mm/px · z∈[-276,-195]mm · 3 of 78 slices shown, 4 images]
[im 16/78  soft-tissue]
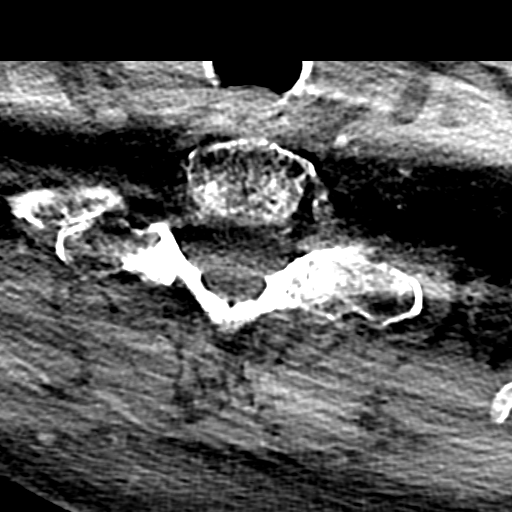
[im 16/78  bone]
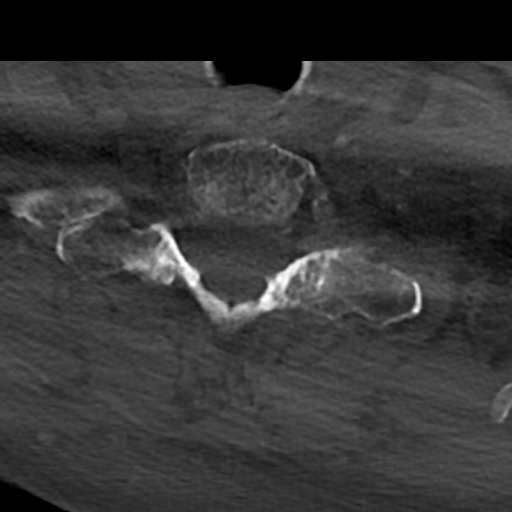
[im 47/78  bone]
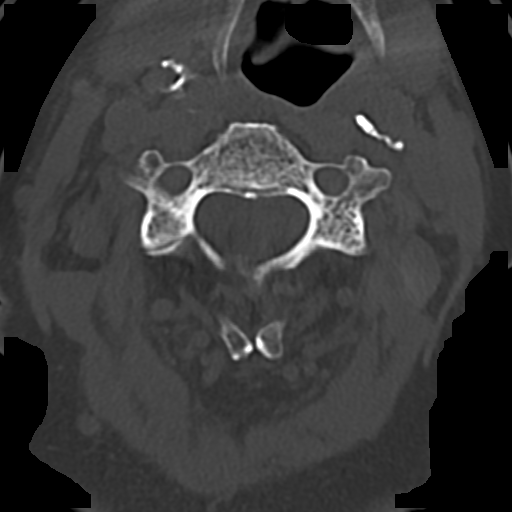
[im 62/78  bone]
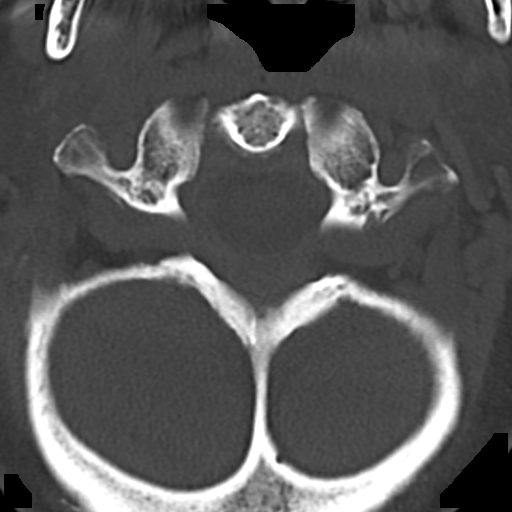

[11 of 33 positions shown; findings below may reference images not displayed]

Multidetector CT imaging of the cervical spine was performed without
intravenous contrast. Multiplanar CT image reconstructions were also
generated.

Multidetector CT imaging of the chest, abdomen and pelvis was
performed following the standard protocol during bolus
administration of intravenous contrast.

RADIATION DOSE REDUCTION: This exam was performed according to the
departmental dose-optimization program which includes automated
exposure control, adjustment of the mA and/or kV according to
patient size and/or use of iterative reconstruction technique.

CONTRAST:  100mL OMNIPAQUE IOHEXOL 350 MG/ML SOLN
FINDINGS: CT HEAD FINDINGS

Brain: No evidence of acute infarction, hemorrhage, hydrocephalus,
extra-axial collection or mass lesion/mass effect. Signs of atrophy
and of chronic microvascular ischemic change as before.

Vascular: No hyperdense vessel or unexpected calcification.

Skull: Normal. Negative for fracture or focal lesion.

Sinuses/Orbits: Visualized paranasal sinuses and orbits are
unremarkable to the extent evaluated.

Other: None.

CT CERVICAL FINDINGS

Alignment: Mild retrolisthesis of C3 on C4 in the setting of
degenerative changes, proximally 1-2 mm. No adjacent soft tissue
swelling. Spinal alignment is otherwise unremarkable in the cervical
spine.

Skull base and vertebrae: No acute fracture. No primary bone lesion
or focal pathologic process.

Soft tissues and spinal canal: No prevertebral fluid or swelling. No
visible canal hematoma.

Disc levels: Multilevel facet arthropathy and disc space narrowing
compatible with marked degenerative changes throughout the cervical
spine. Query nondisplaced fracture of the superior endplate of T2a
without substantial loss of height. This area shows limited
assessment due to streak artifact from shoulder arthroplasty in
terms of assessing for surrounding soft tissue changes. No change in
alignment aside from minimal anterolisthesis approximately 2 mm of
T1 on T2 in the setting of facet arthropathy. Also with
age-indeterminate loss of height at T3

Other:  None.

CT CHEST FINDINGS

Cardiovascular: Moderate to marked calcified and noncalcified
atheromatous plaque in the thoracic aorta. No signs of aneurysmal
dilation. Normal heart size without pericardial effusion or
thickening.

Central pulmonary vasculature is unremarkable to the extent
evaluated.

Mediastinum/Nodes: No adenopathy or acute process in the
mediastinum.

Lungs/Pleura: Basilar atelectasis. No consolidation or sign of
pleural effusion. No pneumothorax. Airways are patent.

Musculoskeletal: See below for full musculoskeletal details.

CT ABDOMEN PELVIS FINDINGS

Hepatobiliary: Cholelithiasis. No signs of perihepatic stranding or
sign of hepatic trauma without focal, suspicious hepatic lesion. No
biliary duct dilation.

Pancreas: Mild pancreatic atrophy without signs of adjacent
stranding or ductal dilation.

Spleen: Normal.

Adrenals/Urinary Tract: Adrenal glands are normal.

Renal cortical scarring bilaterally is mild. Large renal cyst on the
RIGHT measuring 10 x 6.8 cm without suspicious nodule or septation,
compatible with large benign renal cyst. Smaller renal cysts without
suspicious features in the RIGHT and LEFT kidney, 1 in the upper
pole of the RIGHT kidney anteriorly, the other in the upper pole of
the LEFT kidney. No hydronephrosis. No perinephric stranding or
perivesical stranding. No signs of renal trauma.

Stomach/Bowel:

No sign of acute gastrointestinal process with normal appendix.
Colonic diverticulosis of the sigmoid. Stool throughout the colon
without dilation or adjacent stranding.

Stomach under distended without adjacent stranding. Small bowel
without dilation.

Vascular/Lymphatic:

Aortic atherosclerosis. No sign of aneurysm. Smooth contour of the
IVC. There is no gastrohepatic or hepatoduodenal ligament
lymphadenopathy. No retroperitoneal or mesenteric lymphadenopathy.

No pelvic sidewall lymphadenopathy.

Atherosclerotic changes are moderate in the abdominal aorta.

Reproductive: Post hysterectomy without adnexal mass.

Other: No sign of body wall contusion. No sign of ascites. No
abdominal wall hernia. No pneumoperitoneum.

Musculoskeletal: See dedicated report for spinal details involving
thoracic and lumbar spine. No acute displaced rib fracture.
Bilateral shoulder arthroplasty changes limited assessment at the
thoracic inlet.

Spinal degenerative changes throughout the thoracic and lumbar
spine. No visible fracture about the bony pelvis with symmetry of
the bilateral sacroiliac joints and intact pubic symphysis.
IMPRESSION: 1. Query mild irregularity of the superior endplate of T2, this
could represent a nondisplaced or minimally displaced fracture of
the superior endplate though streak artifact does limit assessment
in this area. Subjacent T3 vertebral body shows age-indeterminate
loss of height, see dedicated CT of the thoracic and lumbar spine
for further detail and correlate with site of pain in the spine.
2. No acute traumatic injury to the cervical spine.
3. No acute intracranial abnormality.
4. No acute traumatic injury to the chest, abdomen, or pelvis.
5. Cholelithiasis.
6. Large benign renal cyst. No follow-up recommended for this
finding the absence of localizing symptoms.
7. Colonic diverticulosis without evidence of acute diverticulitis.
8. Aortic atherosclerosis.

Aortic Atherosclerosis ([id number]).

## 2023-10-05 IMAGING — CT CT T SPINE W/O CM
3 of 4 series · 12 of 33 positions shown, 14 images · non-contrast
Comparison: Radiography 2667
COMPARISON: Radiography 2667

Addendum:
CLINICAL DATA: Fell.  Back pain.

EXAM:
CT THORACIC SPINE WITHOUT CONTRAST
TECHNIQUE: Multidetector CT images of the thoracic were obtained using the
standard protocol without intravenous contrast.
RADIATION DOSE REDUCTION: This exam was performed according to the
departmental dose-optimization program which includes automated
exposure control, adjustment of the mA and/or kV according to
patient size and/or use of iterative reconstruction technique.

[Series 1: st thins · axial · 0.28mm/px · z∈[-475,-282]mm · 4 of 414 slices shown, 5 images]
[im 69/414  soft-tissue]
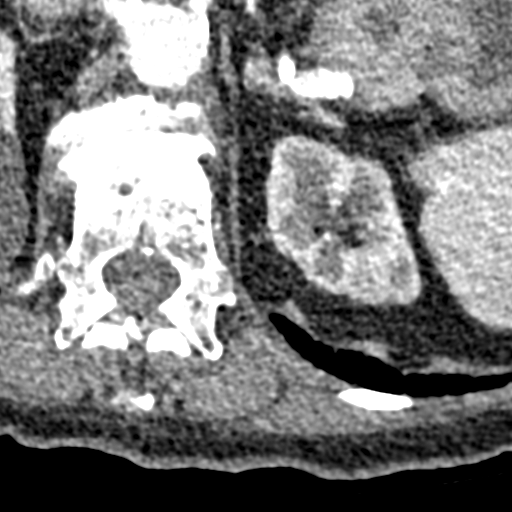
[im 69/414  bone]
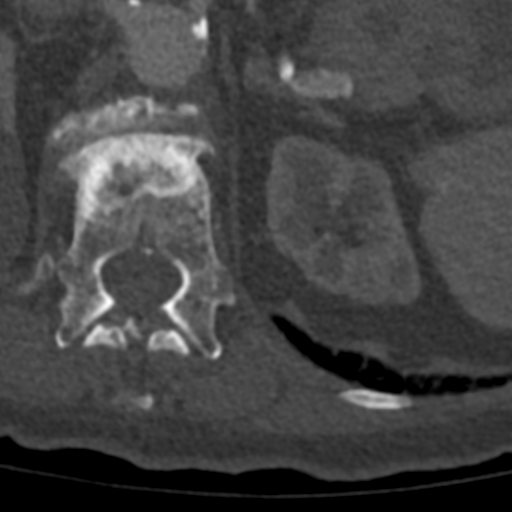
[im 138/414  bone]
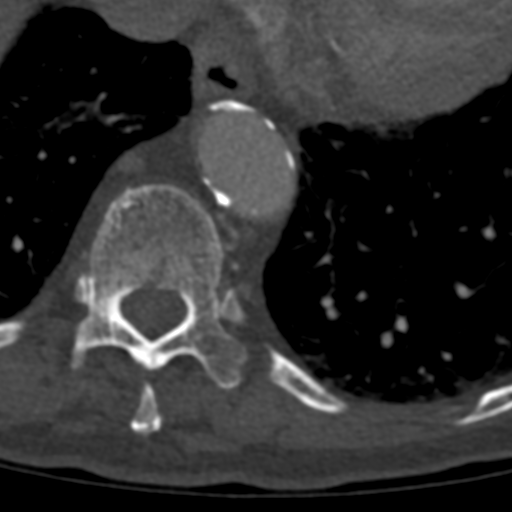
[im 276/414  bone]
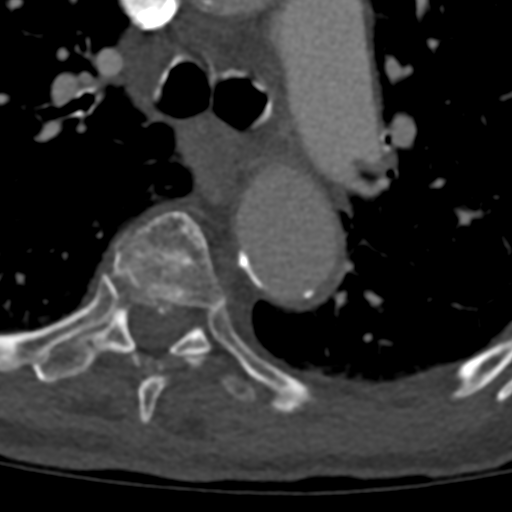
[im 345/414  bone]
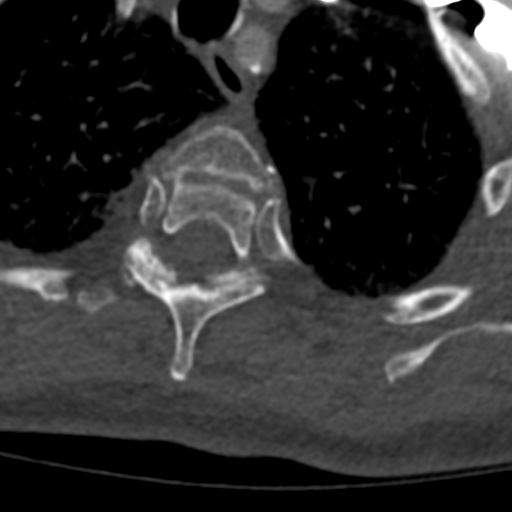

[Series 5: cor · coronal · 0.39mm/px · 3 of 220 slices shown]
[im 44/220  bone]
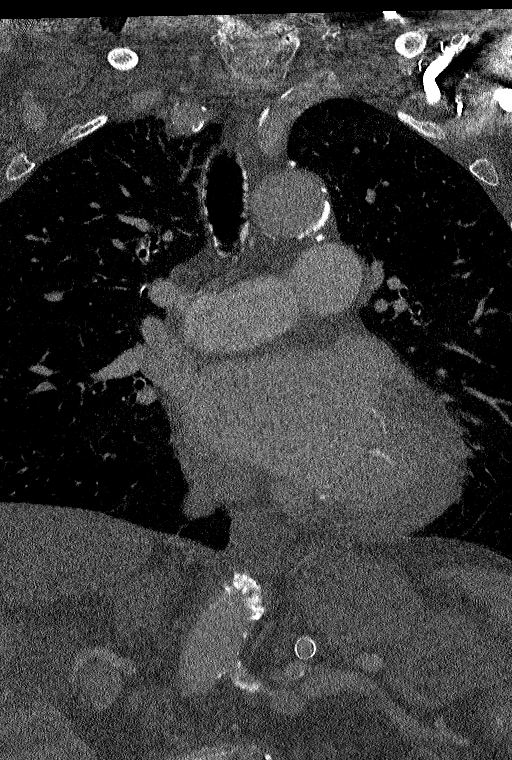
[im 88/220  bone]
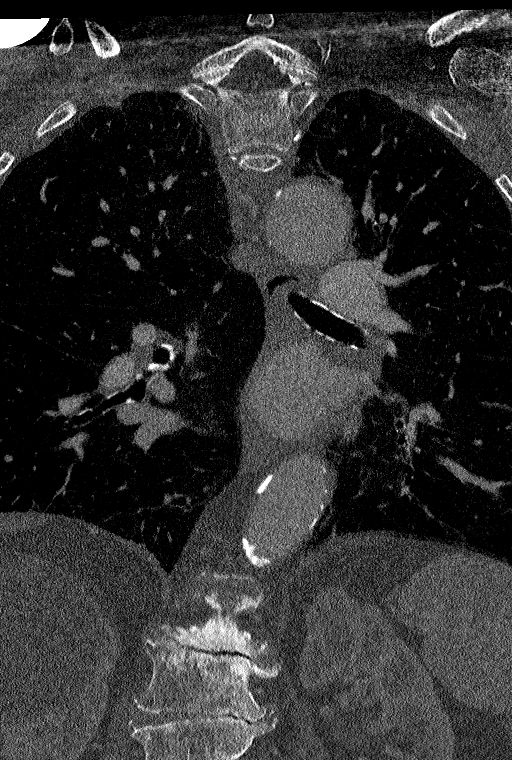
[im 132/220  bone]
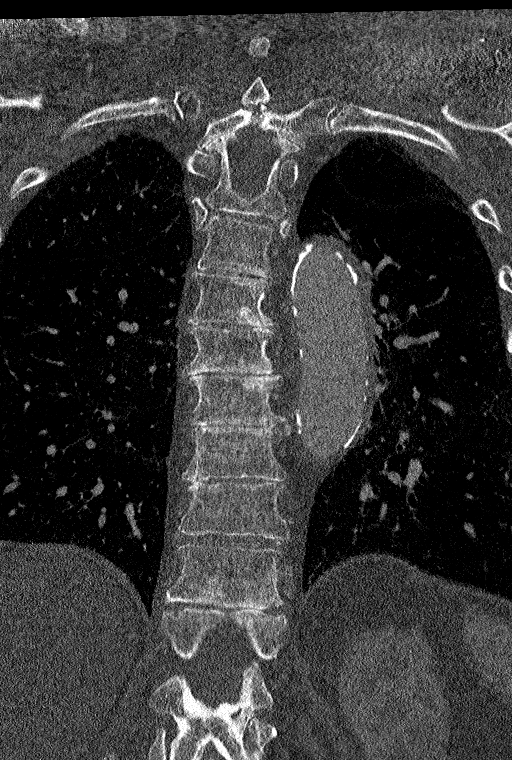

[Series 6: sag · sagittal · 0.39mm/px · 5 of 192 slices shown, 6 images]
[im 64/192  bone]
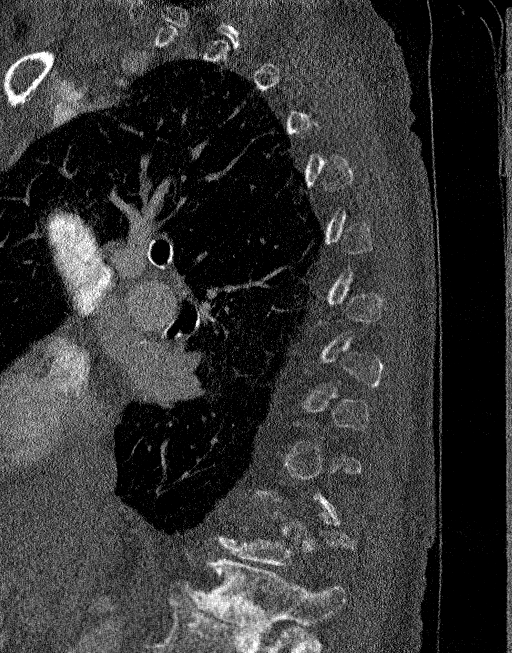
[im 80/192  bone]
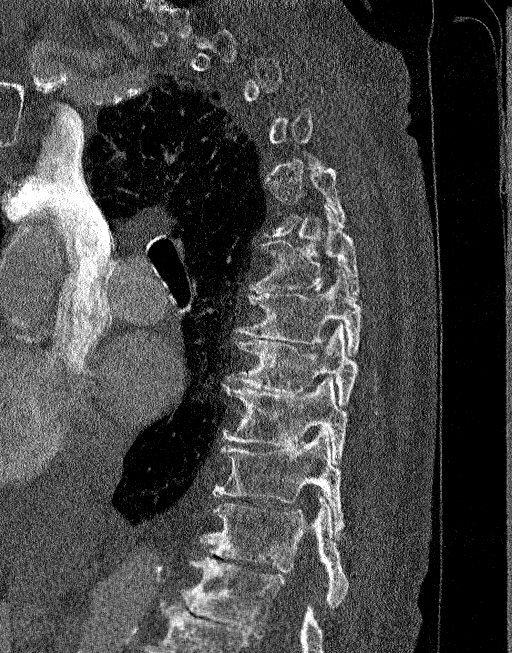
[im 96/192  soft-tissue]
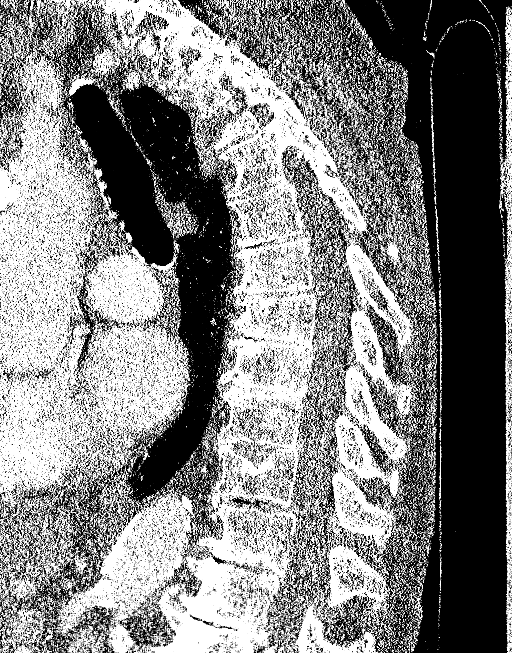
[im 96/192  bone]
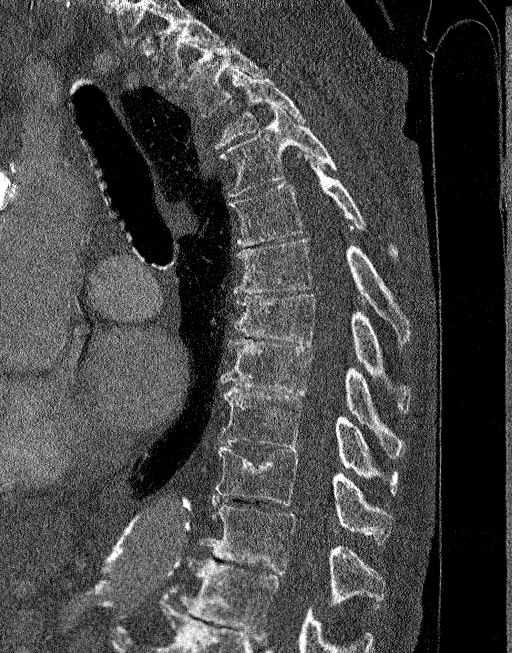
[im 112/192  bone]
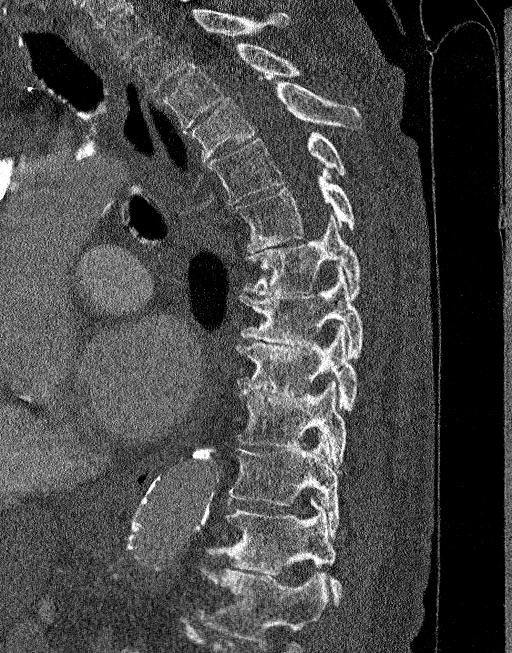
[im 128/192  bone]
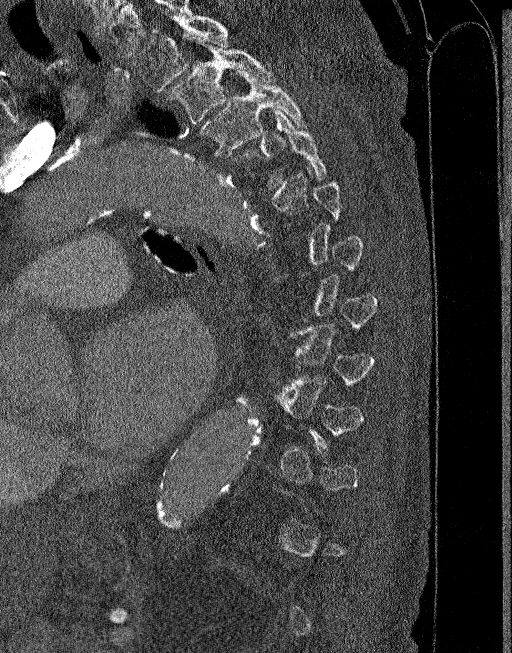

[12 of 33 positions shown; findings below may reference images not displayed]

FINDINGS: Alignment: Mild scoliotic curvature convex to the right. Slightly
increased kyphotic curvature. Degenerative anterolisthesis of 2 mm
at T11-12.

Vertebrae: Old minor superior endplate compression deformity at T11.
Mild wedge deformity of the T4 vertebral body, age uncertain. This
could possibly be acute.

Paraspinal and other soft tissues: Aortic atherosclerosis. Otherwise
negative.

Disc levels: Ordinary thoracic degenerative disc disease with loss
of disc height. Endplate osteophytes. No compressive narrowing of
the canal. At T11-12, there is facet osteoarthritis with 2 mm of
degenerative anterolisthesis.
IMPRESSION: Old superior endplate fracture at T11.

Age indeterminate minor wedge compression fracture at T4. This could
be recent. Is the patient's pain in the upper thoracic region? No
evidence of retropulsed bone.

Ordinary degenerative changes elsewhere throughout the thoracic
spine as above.

ADDENDUM:
Based on review of the cervical spine, the level of the potential
recent fracture is T3 instead of T4.

*** End of Addendum ***
FINDINGS: Alignment: Mild scoliotic curvature convex to the right. Slightly
increased kyphotic curvature. Degenerative anterolisthesis of 2 mm
at T11-12.

Vertebrae: Old minor superior endplate compression deformity at T11.
Mild wedge deformity of the T4 vertebral body, age uncertain. This
could possibly be acute.

Paraspinal and other soft tissues: Aortic atherosclerosis. Otherwise
negative.

Disc levels: Ordinary thoracic degenerative disc disease with loss
of disc height. Endplate osteophytes. No compressive narrowing of
the canal. At T11-12, there is facet osteoarthritis with 2 mm of
degenerative anterolisthesis.
IMPRESSION: Old superior endplate fracture at T11.

Age indeterminate minor wedge compression fracture at T4. This could
be recent. Is the patient's pain in the upper thoracic region? No
evidence of retropulsed bone.

Ordinary degenerative changes elsewhere throughout the thoracic
spine as above.

## 2023-10-05 IMAGING — MR MR HEAD W/O CM
12 of 13 series · 44 of 48 positions shown · non-contrast
Comparison: Prior head CT examinations 01/12/2022 and earlier.

CLINICAL DATA: Provided history: Neuro deficit, acute, stroke
suspected; slurred speech, rule out stroke. Additional history
provided: Multiple falls, slurred speech, stroke suspected.

EXAM:
MRI HEAD WITHOUT CONTRAST
TECHNIQUE: Multiplanar, multiecho pulse sequences of the brain and surrounding
structures were obtained without intravenous contrast.

[Series 5: DWI · axial · 3.0mm · 0.88mm/px · z∈[-120,+32]mm · 8 of 104 slices shown (1 of 4)]
[im 1/104]
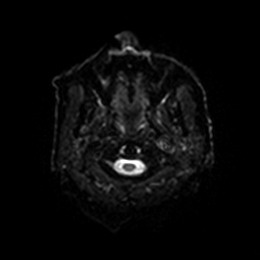
[im 15/104]
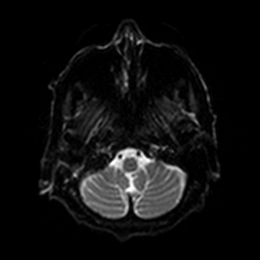
[im 30/104]
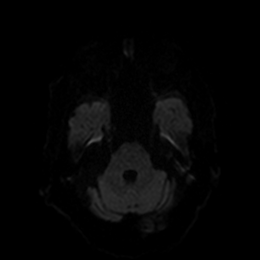
[im 45/104]
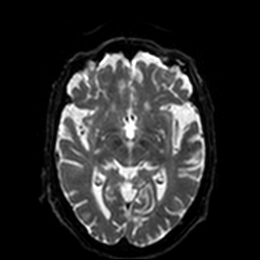
[im 59/104]
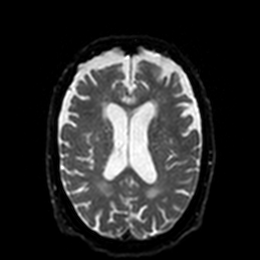
[im 74/104]
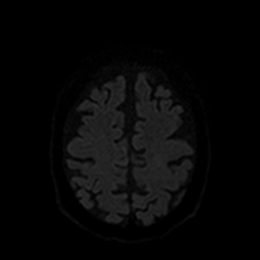
[im 89/104]
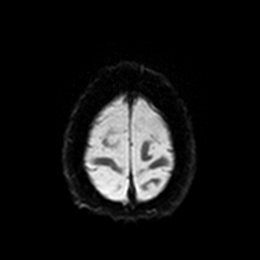
[im 104/104]
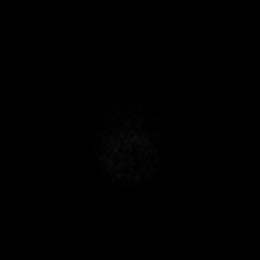

[Series 6: DWI · axial · 3.0mm · 0.88mm/px · z∈[-120,+32]mm · 4 of 52 slices shown (2 of 4)]
[im 1/52]
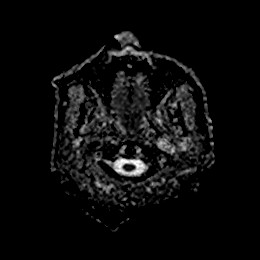
[im 18/52]
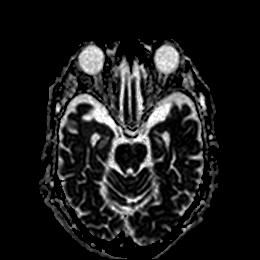
[im 35/52]
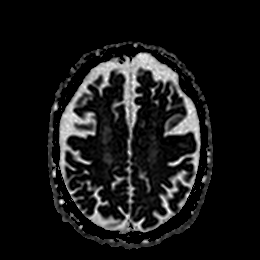
[im 52/52]
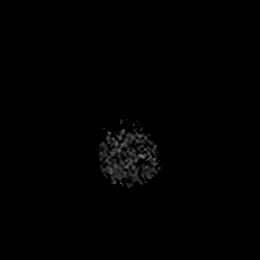

[Series 7: DWI · coronal · 4.0mm · 0.88mm/px · 5 of 72 slices shown (3 of 4)]
[im 1/72]
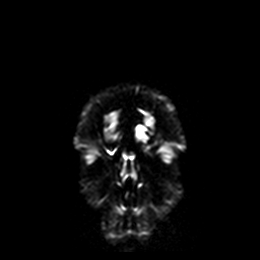
[im 18/72]
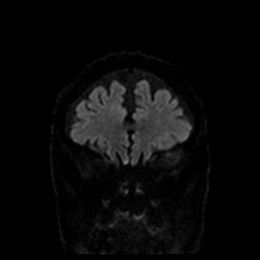
[im 36/72]
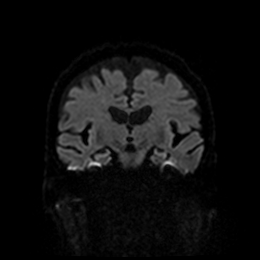
[im 54/72]
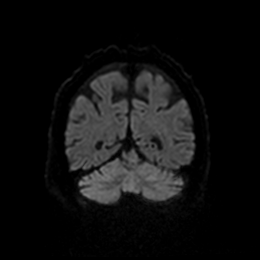
[im 72/72]
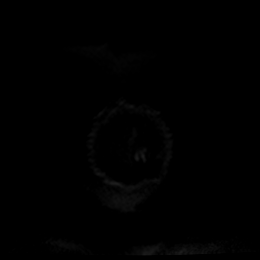

[Series 8: DWI · coronal · 4.0mm · 0.88mm/px · 3 of 36 slices shown (4 of 4)]
[im 1/36]
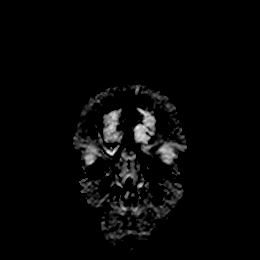
[im 18/36]
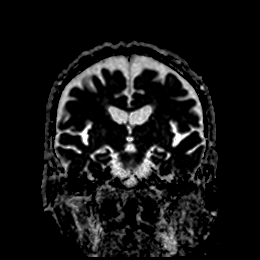
[im 36/36]
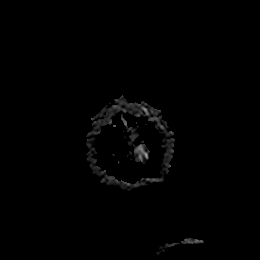

[Series 9: T1 · sagittal · 5.0mm · 0.75mm/px · 2 of 25 slices shown]
[im 1/25]
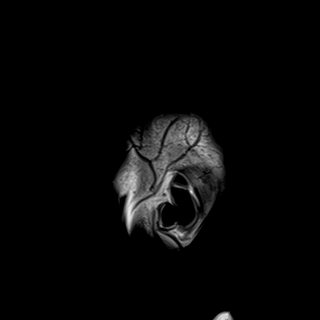
[im 25/25]
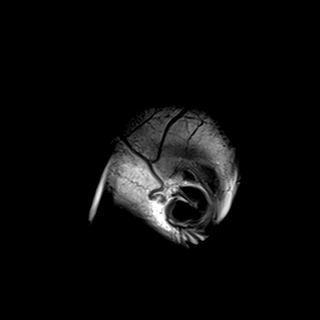

[Series 10: T2 · axial · 5.0mm · 0.72mm/px · z∈[-120,+36]mm · 2 of 27 slices shown (1 of 2)]
[im 1/27]
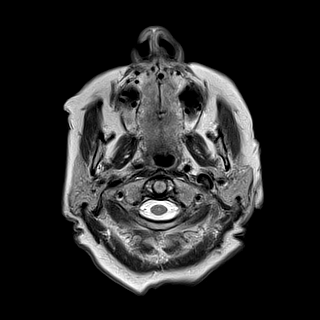
[im 27/27]
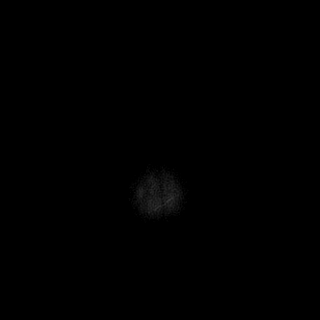

[Series 11: FLAIR · axial · 5.0mm · 0.45mm/px · z∈[-121,+34]mm · 2 of 27 slices shown]
[im 1/27]
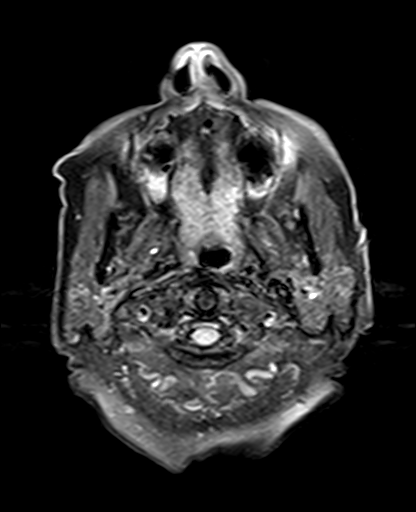
[im 27/27]
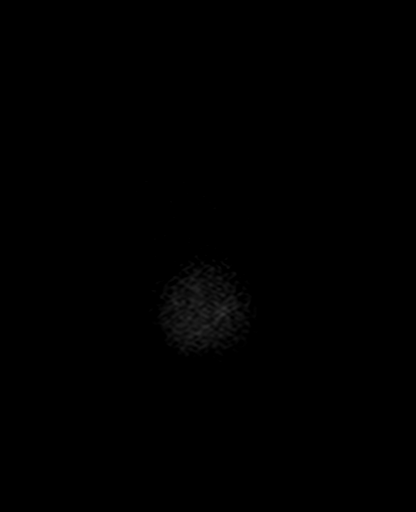

[Series 12: mag_images · axial · 3.0mm · 0.90mm/px · z∈[-125,+39]mm · 4 of 56 slices shown]
[im 1/56]
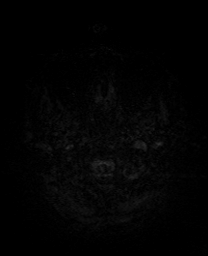
[im 19/56]
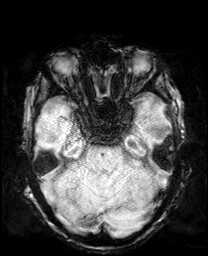
[im 37/56]
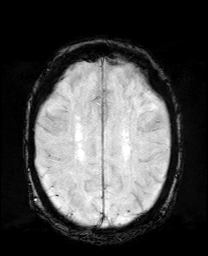
[im 56/56]
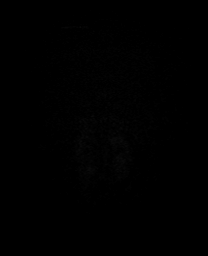

[Series 13: pha_images · axial · 3.0mm · 0.90mm/px · z∈[-122,+39]mm · 4 of 53 slices shown]
[im 1/53]
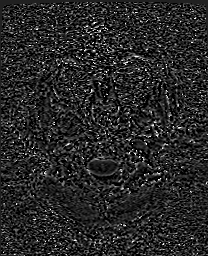
[im 18/53]
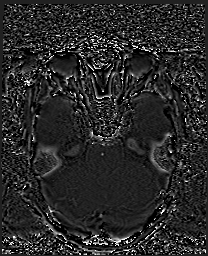
[im 35/53]
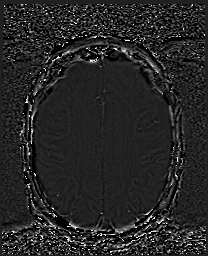
[im 53/53]
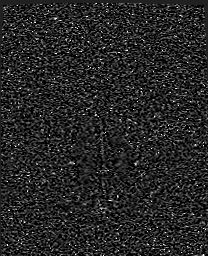

[Series 14: swi_images · axial · 3.0mm · 0.90mm/px · z∈[-125,+39]mm · 4 of 56 slices shown]
[im 1/56]
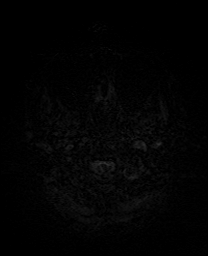
[im 19/56]
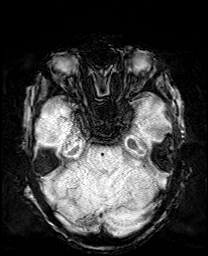
[im 37/56]
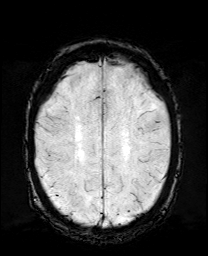
[im 56/56]
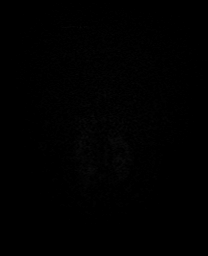

[Series 15: mip_images(sw) · axial · 24.0mm · 0.90mm/px · z∈[-115,+29]mm · 4 of 49 slices shown]
[im 1/49]
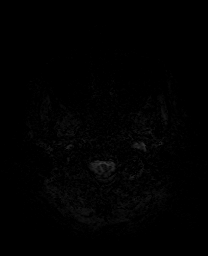
[im 17/49]
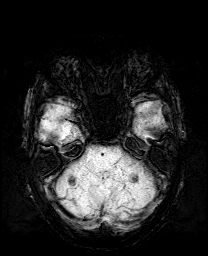
[im 33/49]
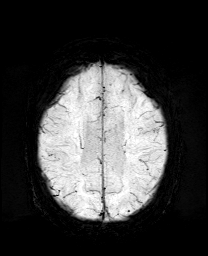
[im 49/49]
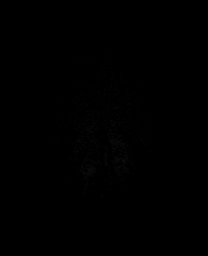

[Series 17: T2 · coronal · 5.0mm · 0.34mm/px · 2 of 29 slices shown (2 of 2)]
[im 1/29]
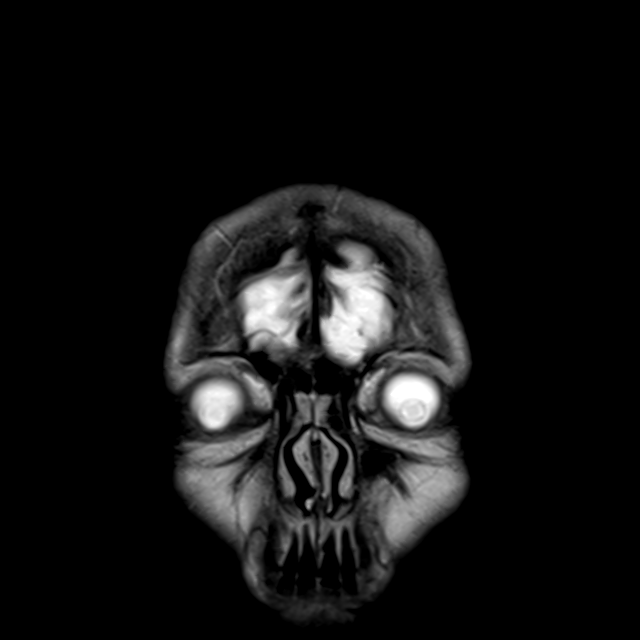
[im 29/29]
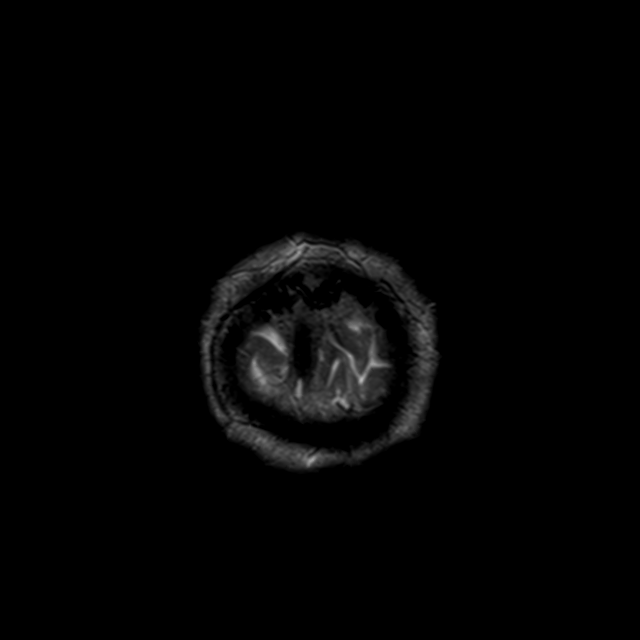

[44 of 48 positions shown; findings below may reference images not displayed]

FINDINGS: Brain:

Mild for age generalized parenchymal atrophy.

Mild-to-moderate multifocal T2 FLAIR hyperintense signal abnormality
within the cerebral white matter, nonspecific but compatible with
chronic small vessel ischemic disease.

Chronic lacunar infarct within the right lentiform
nucleus/retrolenticular white matter.

There are a several punctate chronic microhemorrhages scattered
within the brainstem and supratentorial brain.

There is no acute infarct.

No evidence of an intracranial mass.

No extra-axial fluid collection.

No midline shift.

Vascular: Maintained flow voids within the proximal large arterial
vessels.

Skull and upper cervical spine: No focal suspicious marrow lesion.
Mild C3-C4 grade 1 retrolisthesis.

Sinuses/Orbits: No mass or acute finding within the imaged orbits.
Prior bilateral ocular lens replacement. Mild mucosal thickening
within the bilateral ethmoid sinuses.

Other: Trace fluid within the bilateral mastoid air cells.
IMPRESSION: 1. No evidence of acute intracranial abnormality.
2. Mild-to-moderate chronic small vessel ischemic changes within the
cerebral white matter.
3. Chronic lacunar infarct within the right lentiform
nucleus/retrolenticular white matter.
4. Mild for age generalized parenchymal atrophy.
5. Mild mucosal thickening within the bilateral ethmoid sinuses.
6. Trace fluid within the bilateral mastoid air cells.

## 2024-01-04 ENCOUNTER — Other Ambulatory Visit: Payer: Self-pay

## 2024-01-04 ENCOUNTER — Encounter (HOSPITAL_BASED_OUTPATIENT_CLINIC_OR_DEPARTMENT_OTHER): Payer: Self-pay | Admitting: Emergency Medicine

## 2024-01-04 ENCOUNTER — Emergency Department (HOSPITAL_BASED_OUTPATIENT_CLINIC_OR_DEPARTMENT_OTHER)
Admission: EM | Admit: 2024-01-04 | Discharge: 2024-01-04 | Disposition: A | Discharge status: Home / Self Care | Attending: Emergency Medicine | Admitting: Emergency Medicine

## 2024-01-04 DIAGNOSIS — S81802A Unspecified open wound, left lower leg, initial encounter: Secondary | ICD-10-CM

## 2024-01-04 DIAGNOSIS — Z7982 Long term (current) use of aspirin: Secondary | ICD-10-CM | POA: Insufficient documentation

## 2024-01-04 DIAGNOSIS — Z4800 Encounter for change or removal of nonsurgical wound dressing: Secondary | ICD-10-CM | POA: Insufficient documentation

## 2024-01-04 LAB — CBC WITH DIFFERENTIAL/PLATELET
Abs Immature Granulocytes: 0.03 10*3/uL (ref 0.00–0.07)
Basophils Absolute: 0 10*3/uL (ref 0.0–0.1)
Basophils Relative: 1 %
Eosinophils Absolute: 0.2 10*3/uL (ref 0.0–0.5)
Eosinophils Relative: 2 %
HCT: 34.1 % — ABNORMAL LOW (ref 36.0–46.0)
Hemoglobin: 11 g/dL — ABNORMAL LOW (ref 12.0–15.0)
Immature Granulocytes: 0 %
Lymphocytes Relative: 15 %
Lymphs Abs: 1.1 10*3/uL (ref 0.7–4.0)
MCH: 28.3 pg (ref 26.0–34.0)
MCHC: 32.3 g/dL (ref 30.0–36.0)
MCV: 87.7 fL (ref 80.0–100.0)
Monocytes Absolute: 0.8 10*3/uL (ref 0.1–1.0)
Monocytes Relative: 11 %
Neutro Abs: 4.8 10*3/uL (ref 1.7–7.7)
Neutrophils Relative %: 71 %
Platelets: 183 10*3/uL (ref 150–400)
RBC: 3.89 MIL/uL (ref 3.87–5.11)
RDW: 13.4 % (ref 11.5–15.5)
WBC: 6.9 10*3/uL (ref 4.0–10.5)
nRBC: 0 % (ref 0.0–0.2)

## 2024-01-04 LAB — BASIC METABOLIC PANEL WITH GFR
Anion gap: 11 (ref 5–15)
BUN: 27 mg/dL — ABNORMAL HIGH (ref 8–23)
CO2: 27 mmol/L (ref 22–32)
Calcium: 8.6 mg/dL — ABNORMAL LOW (ref 8.9–10.3)
Chloride: 100 mmol/L (ref 98–111)
Creatinine, Ser: 0.99 mg/dL (ref 0.44–1.00)
GFR, Estimated: 53 mL/min — ABNORMAL LOW (ref >60)
Glucose, Bld: 103 mg/dL — ABNORMAL HIGH (ref 70–99)
Potassium: 4.6 mmol/L (ref 3.5–5.1)
Sodium: 138 mmol/L (ref 135–145)

## 2024-01-04 NOTE — ED Provider Notes (Signed)
 [place] EMERGENCY DEPARTMENT AT [facility] Provider Note   CSN: [id number] Arrival date & time: 01/04/24  1504     History  Chief Complaint  Patient presents with   Wound Check    Pamela Braun is a 89 y.o. female who presents to the emergency department for a wound check.  Patient has a wound to her left lower leg which happened roughly 3-5 months ago, but she is not entirely sure.  Patient states that she has a home health nurse who has been dressing the wound but that the home health nurse believed it was infected so she thought she should be seen.  Patient states she was seen roughly a week ago and placed on outpatient antibiotics, however based on chart review patient was seen on 01/02/2024 and a prescription for clindamycin was given as well as an x-ray of the left leg being performed.  Patient denies fever, chills, chest pain, shortness of breath, issues with range of motion of the leg, severe pain.  Patient states that the wound has been the same as it has been the last few months, that she only came in because she was worried about infection.  States that original injury occurred due to hitting leg on golf cart.   Wound Check Pertinent negatives include no chest pain, no headaches and no shortness of breath.      Home Medications Prior to Admission medications   Medication Sig Start Date End Date Taking? Authorizing Provider  acetaminophen  (TYLENOL ) 500 MG tablet Take 500 mg by mouth every 6 (six) hours as needed for mild pain.     [provider]  aspirin 81 MG chewable tablet Chew 81 mg by mouth.    [provider]  Calcium-Vitamin D-Vitamin K 500-500-40 MG-UNT-MCG CHEW Chew 500 mg by mouth.    [provider]  cephALEXin  (KEFLEX ) 500 MG capsule Take 1 capsule (500 mg total) by mouth 4 (four) times daily. 08/18/15   Edelmira Goodness, PA-C  diclofenac sodium (VOLTAREN) 1 % GEL Place 2 g onto the skin daily as needed. For leg pain per  patient 08/25/13   [provider]  levothyroxine  (SYNTHROID , LEVOTHROID) 125 MCG tablet Take 125 mcg by mouth daily before breakfast.    [provider]  metoprolol  (LOPRESSOR ) 50 MG tablet Take 50 mg by mouth. 11/10/13   [provider]  Multiple Vitamins-Minerals (VISION-VITE PRESERVE PO) Take by mouth.    [provider]  ranitidine (ZANTAC) 150 MG tablet Take 150 mg by mouth.    [provider]  tapentadol  (NUCYNTA ) 50 MG TABS tablet Take 1 tablet (50 mg total) by mouth every 6 (six) hours as needed for moderate pain or severe pain. 08/18/15   Edelmira Goodness, PA-C      Allergies    Anesthetics, amide; Celecoxib; Codeine; Ibuprofen; Meperidine; Naproxen; Sulfamethoxazole-trimethoprim; Naloxone; Pentazocine; Silver nitrate; and Azithromycin    Review of Systems   Review of Systems  Constitutional:  Negative for chills, diaphoresis and fever.  Eyes:  Negative for visual disturbance.  Respiratory:  Negative for chest tightness and shortness of breath.   Cardiovascular:  Negative for chest pain.  Skin:  Positive for wound (Wound to calf region of left leg, has been there at least 3 to 4 months, patient unsure of complete timeline).  Neurological:  Negative for dizziness, weakness and headaches.    Physical Exam Updated Vital Signs BP (!) 158/71 (BP Location: Left Arm)   Pulse 75   Temp  98.5 F (36.9 C)   Resp 18   LMP  (LMP Unknown) Comment: pt. is 87   SpO2 95%  Physical Exam Vitals and nursing note reviewed.  Constitutional:      General: She is awake. She is not in acute distress.    Appearance: Normal appearance. She is not ill-appearing, toxic-appearing or diaphoretic.  HENT:     Head: Normocephalic and atraumatic.  Eyes:     Extraocular Movements: Extraocular movements intact.  Cardiovascular:     Rate and Rhythm: Normal rate and regular rhythm.  Pulmonary:     Effort: Pulmonary effort is normal. No respiratory distress.      Breath sounds: No wheezing, rhonchi or rales.  Musculoskeletal:     Comments: Motion of the left leg, left ankle, left toes intact, strength comparable from left to right.  Patient states she is ambulatory under her own power without issue with cane which is her baseline  Skin:    General: Skin is warm and dry.     Capillary Refill: Capillary refill takes less than 2 seconds.     Comments: Circular wound on back of left leg and calf region, limited to no surrounding redness or purulent drainage at this time.  Granulation tissue visualized.  Neurological:     General: No focal deficit present.     Mental Status: She is alert and oriented to person, place, and time.  Psychiatric:        Mood and Affect: Mood normal.        Behavior: Behavior normal. Behavior is cooperative.     ED Results / Procedures / Treatments   Labs (all labs ordered are listed, but only abnormal results are displayed) Labs Reviewed  CBC WITH DIFFERENTIAL/PLATELET - Abnormal; Notable for the following components:      Result Value   Hemoglobin 11.0 (*)    HCT 34.1 (*)    All other components within normal limits  BASIC METABOLIC PANEL WITH GFR - Abnormal; Notable for the following components:   Glucose, Bld 103 (*)    BUN 27 (*)    Calcium 8.6 (*)    GFR, Estimated 53 (*)    All other components within normal limits    EKG None  Radiology No results found.  Procedures Procedures    Medications Ordered in ED Medications - No data to display  ED Course/ Medical Decision Making/ A&P    Patient presents to the ED for concern of left leg wound, this involves an extensive number of treatment options, and is a complaint that carries with it a high risk of complications and morbidity.  The differential diagnosis includes sepsis, infection, laceration, fracture, osteomyelitis, etc.   Co morbidities that complicate the patient evaluation  Age   Additional history obtained:  Additional history  obtained from [facility] and Outside Medical Records   External records from outside source obtained and reviewed including previous PCP visit on 01/02/2024 where patient was prescribed clindamycin as well as a left tib/fib x-ray was completed.   Lab Tests:  I Ordered, and personally interpreted labs.  The pertinent results include: CBC - unremarkable other than hemoglobin of 11, HCT 34.1 BMP-glucose 103, BUN 27, calcium 8.6, estimated GFR 53   Medicines ordered and prescription drug management: I have reviewed the patients home medicines and have made adjustments as needed   Test Considered:  X-ray of left tip/fib: Left leg x-ray considered to rule out possible osteomyelitis, patient does not have an elevated white  blood cell count, no fever, no chills, no systemic symptoms.  According to chart review patient had a PCP visit 2 days ago where an x-ray was completed.  Declined at this time.  X-ray 2 days ago impression showed no radiographic evidence of osteomyelitis.   Critical Interventions:  None   Problem List / ED Course:  Left leg wound present for at least 3-4 months According to chart review patient seen 01/02/2024 for left leg wound, prescribed clindamycin and x-ray of left tib/fib completed which showed no evidence of osteomyelitis Based on history, physical exam and lab workup today, I do not see a reason to escalate plan at this time, patient instructed if she begins to have systemic symptoms to please return to the emergency department or seek further medical care.  Symptoms of this would include fever, chills, weakness, chest pain, shortness of breath, purulent drainage from wound, increasing redness surrounding wound.  Vital stable, low clinical suspicion for sepsis. Patient and family member instructed on wet-to-dry dressings and the need to change dressing every day.  Patient instructed to continue antibiotics as prescribed and complete the entire course. Patient instructed  to follow-up with PCP and to try to get into wound care as soon as possible.  Based on chart review patient has follow-up with PCP scheduled. Patient given return precautions Patient discharged Patient instructed on wet-to-dry dressings and the importance of changing every day.  Reevaluation:  After the interventions noted above, I reevaluated the patient and found that they have :stayed the same   Social Determinants of Health:  None   Dispostion:  After consideration of the diagnostic results and the patients response to treatment, I feel that the patent would benefit from discharge and continued outpatient therapy as prescribed.  Patient should follow-up with primary care provider as well as wound care as available.  Return precautions given.   Click here for ABCD2, HEART and other calculatorsREFRESH Note before signing :1}                              Medical Decision Making         Final Clinical Impression(s) / ED Diagnoses Final diagnoses:  None    Rx / DC Orders ED Discharge Orders     None         Susanne Epps 01/04/24 1906    Almond Army, MD 01/07/24 [phone]

## 2024-01-04 NOTE — Discharge Instructions (Addendum)
 It was a pleasure taking care of you today.  On your history, physical exam, and lab findings today, I feel the best plan of action is for you to continue taking your at home antibiotics as prescribed and follow-up with your primary care provider.  I do feel that you would benefit from wound care therapy, please continue to try and call or get in touch with your primary care provider to see if your appointment can be moved up.  Please use a wet-to-dry dressing, and change the dressing daily over the wound.  If you begin to experience any signs of infection, including but to fever, chills, redness surrounding the wound, abnormal drainage please return to the emergency department or seek further medical care.  Follow-up with your specialist and primary care provider as scheduled for ongoing diagnosis and treatment.

## 2024-01-04 NOTE — ED Notes (Signed)
 Wet-to-dry dressing applied to LLE. Process & supplies explained to pt & husband at bedside, both voiced understanding.

## 2024-01-04 NOTE — ED Triage Notes (Signed)
 LEFT lower leg wound Happened around 3 months ago. Was supposed to be seen by wound care but has not been able to see them yet. Provider concerned for infection sent for eval

## 2024-04-24 ENCOUNTER — Emergency Department (HOSPITAL_COMMUNITY)

## 2024-04-24 ENCOUNTER — Emergency Department (HOSPITAL_COMMUNITY): Admission: EM | Admit: 2024-04-24 | Discharge: 2024-04-25 | Disposition: A | Discharge status: Home / Self Care

## 2024-04-24 ENCOUNTER — Encounter (HOSPITAL_COMMUNITY): Payer: Self-pay

## 2024-04-24 DIAGNOSIS — Z7982 Long term (current) use of aspirin: Secondary | ICD-10-CM | POA: Insufficient documentation

## 2024-04-24 DIAGNOSIS — W19XXXA Unspecified fall, initial encounter: Secondary | ICD-10-CM

## 2024-04-24 DIAGNOSIS — S8002XA Contusion of left knee, initial encounter: Secondary | ICD-10-CM | POA: Insufficient documentation

## 2024-04-24 DIAGNOSIS — S8001XA Contusion of right knee, initial encounter: Secondary | ICD-10-CM | POA: Diagnosis not present

## 2024-04-24 DIAGNOSIS — W541XXA Struck by dog, initial encounter: Secondary | ICD-10-CM | POA: Diagnosis not present

## 2024-04-24 DIAGNOSIS — S0990XA Unspecified injury of head, initial encounter: Secondary | ICD-10-CM

## 2024-04-24 DIAGNOSIS — M7989 Other specified soft tissue disorders: Secondary | ICD-10-CM | POA: Diagnosis not present

## 2024-04-24 DIAGNOSIS — S0181XA Laceration without foreign body of other part of head, initial encounter: Secondary | ICD-10-CM | POA: Insufficient documentation

## 2024-04-24 MED ORDER — LIDOCAINE HCL (PF) 1 % IJ SOLN
10.0000 mL | Freq: Once | INTRAMUSCULAR | Status: AC
  Administered 2024-04-24: 10 mL
  Filled 2024-04-24: qty 30

## 2024-04-24 NOTE — ED Triage Notes (Signed)
 Pt comes from home via [facility] after a unwitnessed fall, hit door frame, pt has bruising and swelling to the L knee, lac to forehead, bleeding controlled, no LOC, no thinners

## 2024-04-24 NOTE — ED Provider Notes (Signed)
 [place] EMERGENCY DEPARTMENT AT [facility] Provider Note   CSN: [id number] Arrival date & time: 04/24/24  2032     Patient presents with: Pamela Braun is a 89 y.o. female.  {Add pertinent medical, surgical, social history, OB history to YEP:67052}  Fall Pertinent negatives include no chest pain, no abdominal pain and no shortness of breath.   Patient presents with fall.  Patient states that she tripped over a dog bed.  Hit the left side of her forehead.  Did not lose conscious.  Takes no anticoagulation.  No new cervical thoracic or lumbar pain.  Patient endorses pain to bilateral knees.  No chest pain or shortness of breath.  No abdominal pain.  No chest pain.  No clear chest pain or hemoptysis.  Otherwise, denies all complaints.    Previous medical history reviewed : Patient last tetanus shot was in 2022.   Prior to Admission medications   Medication Sig Start Date End Date Taking? Authorizing Provider  acetaminophen  (TYLENOL ) 500 MG tablet Take 500 mg by mouth every 6 (six) hours as needed for mild pain.     [provider]  aspirin 81 MG chewable tablet Chew 81 mg by mouth.    [provider]  Calcium-Vitamin D-Vitamin K 500-500-40 MG-UNT-MCG CHEW Chew 500 mg by mouth.    [provider]  cephALEXin  (KEFLEX ) 500 MG capsule Take 1 capsule (500 mg total) by mouth 4 (four) times daily. 08/18/15   Lawrance Rogue, PA-C  diclofenac sodium (VOLTAREN) 1 % GEL Place 2 g onto the skin daily as needed. For leg pain per patient 08/25/13   [provider]  levothyroxine  (SYNTHROID , LEVOTHROID) 125 MCG tablet Take 125 mcg by mouth daily before breakfast.    [provider]  metoprolol  (LOPRESSOR ) 50 MG tablet Take 50 mg by mouth. 11/10/13   [provider]  Multiple Vitamins-Minerals (VISION-VITE PRESERVE PO) Take by mouth.    [provider]  ranitidine (ZANTAC) 150 MG tablet Take 150 mg by mouth.     [provider]  tapentadol  (NUCYNTA ) 50 MG TABS tablet Take 1 tablet (50 mg total) by mouth every 6 (six) hours as needed for moderate pain or severe pain. 08/18/15   Lawrance Rogue, PA-C    Allergies: Anesthetics, amide; Celecoxib; Codeine; Ibuprofen; Meperidine; Naproxen; Sulfamethoxazole-trimethoprim; Naloxone; Pentazocine; Silver nitrate; and Azithromycin    Review of Systems  Constitutional:  Negative for chills and fever.  HENT:  Negative for ear pain and sore throat.   Eyes:  Negative for pain and visual disturbance.  Respiratory:  Negative for cough and shortness of breath.   Cardiovascular:  Negative for chest pain and palpitations.  Gastrointestinal:  Negative for abdominal pain and vomiting.  Genitourinary:  Negative for dysuria and hematuria.  Musculoskeletal:  Negative for arthralgias and back pain.  Skin:  Negative for color change and rash.  Neurological:  Negative for seizures and syncope.  All other systems reviewed and are negative.   Updated Vital Signs BP (!) 158/87 (BP Location: Right Arm)   Pulse 76   Temp 98.4 F (36.9 C) (Oral)   Resp 14   LMP  (LMP Unknown) Comment: pt. is 87   SpO2 96%   Physical Exam Vitals and nursing note reviewed.  Constitutional:      General: She is not in acute distress.    Appearance: She is well-developed.  HENT:     Head: Normocephalic and atraumatic.  Eyes:  Conjunctiva/sclera: Conjunctivae normal.  Cardiovascular:     Rate and Rhythm: Normal rate and regular rhythm.     Heart sounds: No murmur heard. Pulmonary:     Effort: Pulmonary effort is normal. No respiratory distress.     Breath sounds: Normal breath sounds.  Abdominal:     Palpations: Abdomen is soft.     Tenderness: There is no abdominal tenderness.  Musculoskeletal:        General: No swelling.     Cervical back: Neck supple.       Legs:  Skin:    General: Skin is warm and dry.     Capillary Refill: Capillary refill takes less than 2  seconds.  Neurological:     Mental Status: She is alert.  Psychiatric:        Mood and Affect: Mood normal.     (all labs ordered are listed, but only abnormal results are displayed) Labs Reviewed - No data to display  EKG: None  Radiology: No results found.  {Document cardiac monitor, telemetry assessment procedure when appropriate:32947} .Laceration Repair  Date/Time: 04/24/2024 11:05 PM  Performed by: Simon Lavonia SAILOR, MD Authorized by: Simon Lavonia SAILOR, MD   Consent:    Consent obtained:  Verbal   Consent given by:  Patient   Risks, benefits, and alternatives were discussed: yes     Risks discussed:  Infection, need for additional repair, nerve damage, poor wound healing, pain and poor cosmetic result   Alternatives discussed:  Referral Universal protocol:    Patient identity confirmed:  Verbally with patient Anesthesia:    Anesthesia method:  Local infiltration   Local anesthetic:  Lidocaine  1% w/o epi Laceration details:    Location:  Face   Face location:  Forehead   Length (cm):  8.5 Pre-procedure details:    Preparation:  Patient was prepped and draped in usual sterile fashion and imaging obtained to evaluate for foreign bodies Exploration:    Hemostasis achieved with:  Direct pressure   Wound exploration: wound explored through full range of motion and entire depth of wound visualized   Treatment:    Area cleansed with:  Povidone-iodine and saline   Amount of cleaning:  Extensive   Irrigation solution:  Sterile saline   Irrigation volume:  1000   Irrigation method:  Pressure wash Skin repair:    Repair method:  Sutures   Suture size:  5-0   Suture material:  Prolene   Suture technique:  Simple interrupted   Number of sutures:  14 Approximation:    Approximation:  Close Repair type:    Repair type:  Intermediate Post-procedure details:    Dressing:  Non-adherent dressing   Procedure completion:  Tolerated    Medications Ordered in the ED - No data  to display    {Click here for ABCD2, HEART and other calculators REFRESH Note before signing:1}                              Medical Decision Making Amount and/or Complexity of Data Reviewed Radiology: ordered.  Risk Prescription drug management.    Patient presents with fall.  Patient states that she tripped over a dog bed.  Hit the left side of her forehead.  Did not lose conscious.  Takes no anticoagulation.  No new cervical thoracic or lumbar pain.  Patient endorses pain to bilateral knees.  No chest pain or shortness of breath.  No abdominal pain.  No chest pain.  No clear chest pain or hemoptysis.  Otherwise, denies all complaints.    Previous medical history reviewed : Patient last tetanus shot was in 2022.   Upon exam, patient in no acute distress.     {Document critical care time when appropriate  Document review of labs and clinical decision tools ie CHADS2VASC2, etc  Document your independent review of radiology images and any outside records  Document your discussion with family members, caretakers and with consultants  Document social determinants of health affecting pt's care  Document your decision making why or why not admission, treatments were needed:32947:::1}   Final diagnoses:  None    ED Discharge Orders     None

## 2024-04-25 NOTE — ED Provider Notes (Signed)
 Patient signed out pending CT scan of the left knee.  CT scan without any evidence of tibial plateau fracture.  There is a hematoma.  Ace bandage applied.  Patient was able to ambulate per nursing.  Son is here to take her home.   Bari Charmaine FALCON, MD 04/25/24 [phone]

## 2024-04-25 NOTE — Discharge Instructions (Signed)
 Please have the sutures removed in 7 days.  If you have any kind of redness or discharge from the area then please come back to the ED for further evaluation.   You can take showers with the sutures in place.

## 2024-06-13 ENCOUNTER — Emergency Department (HOSPITAL_COMMUNITY): Admission: EM | Admit: 2024-06-13 | Discharge: 2024-06-13 | Discharge status: Against Medical Advice

## 2024-06-13 ENCOUNTER — Emergency Department (HOSPITAL_COMMUNITY)

## 2024-06-13 ENCOUNTER — Other Ambulatory Visit: Payer: Self-pay

## 2024-06-13 DIAGNOSIS — S0083XA Contusion of other part of head, initial encounter: Secondary | ICD-10-CM | POA: Insufficient documentation

## 2024-06-13 DIAGNOSIS — Z79890 Hormone replacement therapy (postmenopausal): Secondary | ICD-10-CM | POA: Insufficient documentation

## 2024-06-13 DIAGNOSIS — Z7982 Long term (current) use of aspirin: Secondary | ICD-10-CM | POA: Insufficient documentation

## 2024-06-13 DIAGNOSIS — I4891 Unspecified atrial fibrillation: Secondary | ICD-10-CM | POA: Diagnosis not present

## 2024-06-13 DIAGNOSIS — W07XXXA Fall from chair, initial encounter: Secondary | ICD-10-CM | POA: Insufficient documentation

## 2024-06-13 DIAGNOSIS — E039 Hypothyroidism, unspecified: Secondary | ICD-10-CM | POA: Diagnosis not present

## 2024-06-13 DIAGNOSIS — S0990XA Unspecified injury of head, initial encounter: Secondary | ICD-10-CM | POA: Diagnosis present

## 2024-06-13 DIAGNOSIS — I10 Essential (primary) hypertension: Secondary | ICD-10-CM | POA: Insufficient documentation

## 2024-06-13 DIAGNOSIS — W19XXXA Unspecified fall, initial encounter: Secondary | ICD-10-CM

## 2024-06-13 NOTE — ED Provider Notes (Signed)
 [place] EMERGENCY DEPARTMENT AT [facility] Provider Note   CSN: [id number] Arrival date & time: 06/13/24  1429     Patient presents with: Head Injury   Pamela Braun is a 89 y.o. female.  With past medical history of hypertension, hyperlipidemia, hypothyroidism presents to emergency room after fall.  She was sent by urgent care.  Patient reports that last night she stood up from her chair and her left leg gave out causing her to fall.  She reports she struck the posterior aspect of her head against the cement.  She denies any loss of consciousness.  She is not on blood thinner.  She notes a mild headache and she has had some confusion.  She denies any numbness or tingling in extremities, vision change or slurred speech.    Head Injury Associated symptoms: headache        Prior to Admission medications   Medication Sig Start Date End Date Taking? Authorizing Provider  acetaminophen  (TYLENOL ) 500 MG tablet Take 500 mg by mouth every 6 (six) hours as needed for mild pain.     [provider]  aspirin 81 MG chewable tablet Chew 81 mg by mouth.    [provider]  Calcium-Vitamin D-Vitamin K 500-500-40 MG-UNT-MCG CHEW Chew 500 mg by mouth.    [provider]  cephALEXin  (KEFLEX ) 500 MG capsule Take 1 capsule (500 mg total) by mouth 4 (four) times daily. 08/18/15   Lawrance Rogue, PA-C  diclofenac sodium (VOLTAREN) 1 % GEL Place 2 g onto the skin daily as needed. For leg pain per patient 08/25/13   [provider]  levothyroxine  (SYNTHROID , LEVOTHROID) 125 MCG tablet Take 125 mcg by mouth daily before breakfast.    [provider]  metoprolol  (LOPRESSOR ) 50 MG tablet Take 50 mg by mouth. 11/10/13   [provider]  Multiple Vitamins-Minerals (VISION-VITE PRESERVE PO) Take by mouth.    [provider]  ranitidine (ZANTAC) 150 MG tablet Take 150 mg by mouth.    [provider]  tapentadol  (NUCYNTA ) 50  MG TABS tablet Take 1 tablet (50 mg total) by mouth every 6 (six) hours as needed for moderate pain or severe pain. 08/18/15   Lawrance Rogue, PA-C    Allergies: Anesthetics, amide; Celecoxib; Codeine; Ibuprofen; Meperidine; Naproxen; Sulfamethoxazole-trimethoprim; Naloxone; Pentazocine; Silver nitrate; and Azithromycin    Review of Systems  Neurological:  Positive for headaches.    Updated Vital Signs BP (!) 160/86 (BP Location: Left Arm)   Pulse 80   Temp 97.8 F (36.6 C) (Oral)   Resp 18   Ht 5' 3 (1.6 m)   Wt 68 kg   LMP  (LMP Unknown) Comment: pt. is 87   SpO2 98%   BMI 26.57 kg/m   Physical Exam Vitals and nursing note reviewed.  Constitutional:      General: She is not in acute distress.    Appearance: She is not toxic-appearing.  HENT:     Head: Normocephalic and atraumatic.     Comments: Small ecchymosis to posterior head. Eyes:     General: No scleral icterus.    Conjunctiva/sclera: Conjunctivae normal.  Neck:     Comments: No cervical thoracic or lumbar midline tenderness step-off or deformity. Cardiovascular:     Rate and Rhythm: Normal rate and regular rhythm.     Pulses: Normal pulses.     Heart sounds: Normal heart sounds.  Pulmonary:     Effort: Pulmonary effort is normal. No respiratory  distress.     Breath sounds: Normal breath sounds.  Abdominal:     General: Abdomen is flat. Bowel sounds are normal.     Palpations: Abdomen is soft.     Tenderness: There is no abdominal tenderness.  Musculoskeletal:     Comments: TTP over left knee. Neurovasc intact.  Skin:    General: Skin is warm and dry.     Findings: No lesion.  Neurological:     General: No focal deficit present.     Mental Status: She is alert and oriented to person, place, and time. Mental status is at baseline.     (all labs ordered are listed, but only abnormal results are displayed) Labs Reviewed  COMPREHENSIVE METABOLIC PANEL WITH GFR  CBC WITH DIFFERENTIAL/PLATELET  MAGNESIUM   TSH    EKG: None  Radiology: CT Cervical Spine Wo Contrast Result Date: 06/13/2024 EXAM: CT Cervical Spine Without Contrast 06/13/2024 04:42:50 PM TECHNIQUE: CT of the cervical spine was performed without the administration of intravenous contrast. Multiplanar reformatted images are provided for review. Automated exposure control, iterative reconstruction, and/or weight based adjustment of the mA/kV was utilized to reduce the radiation dose to as low as reasonably achievable. COMPARISON: None available. CLINICAL HISTORY: Neck trauma (Age >= 65y) Neck trauma (Age >= 65y) FINDINGS: BONES AND ALIGNMENT: No acute fracture or traumatic malalignment. DEGENERATIVE CHANGES: No significant degenerative changes. SOFT TISSUES: No prevertebral soft tissue swelling. IMPRESSION: 1. No acute abnormality of the cervical spine. Electronically signed by: Franky Stanford MD 06/13/2024 05:10 PM EDT RP Workstation: [id number]   CT Head Wo Contrast Result Date: 06/13/2024 EXAM: CT HEAD WITHOUT CONTRAST 06/13/2024 04:42:50 PM TECHNIQUE: CT of the head was performed without the administration of intravenous contrast. Automated exposure control, iterative reconstruction, and/or weight based adjustment of the mA/kV was utilized to reduce the radiation dose to as low as reasonably achievable. COMPARISON: 04/24/2024 CLINICAL HISTORY: Head trauma, minor (Age >= 65y). FINDINGS: BRAIN AND VENTRICLES: No acute hemorrhage. No evidence of acute infarct. No hydrocephalus. No extra-axial collection. No mass effect or midline shift. Stable degree of atrophy and chronic small vessel ischemia. Unchanged left occipital lobe calcification. Atherosclerosis of skullbase vasculature without hyperdense vessel or abnormal calcification. ORBITS: Bilateral lens resection. SINUSES: No acute abnormality. SOFT TISSUES AND SKULL: No acute soft tissue abnormality. No skull fracture. IMPRESSION: 1. No acute intracranial abnormality. Electronically signed  by: Franky Stanford MD 06/13/2024 05:03 PM EDT RP Workstation: [id number]   DG Knee Complete 4 Views Left Result Date: 06/13/2024 CLINICAL DATA:  fall, pain EXAM: LEFT KNEE - COMPLETE 4+ VIEW COMPARISON:  04/25/2024, 04/24/2024 FINDINGS: Diffuse osteopenia.No acute fracture or dislocation. Moderate joint effusion with multiple intra-articular loose bodies. Advanced tricompartmental osteoarthritis of the knee with lateral subluxation of the tibia with respect to the femur. Similar fragmentation of the osteophyte formation along the medial tibial plateau. Soft tissue swelling about the knee. IMPRESSION: 1. No acute fracture or dislocation. 2. Similar advanced, tricompartmental osteoarthritis of the knee. Electronically Signed   By: Rogelia Myers M.D.   On: 06/13/2024 16:57     Procedures   Medications Ordered in the ED - No data to display  Clinical Course as of 06/13/24 1817  Fri Jun 13, 2024  1758 EKG showing afib, this is new onset. Rate is controlled. Will add labs, recommending further workup. Family and patient at bedside are not wanting this, they are requesting discharge.  [JB]    Clinical Course User Index [JB] Amarie Tarte, Warren SAILOR, PA-C  Medical Decision Making Amount and/or Complexity of Data Reviewed Labs: ordered. Radiology: ordered.   This patient presents to the ED for concern of headache, this involves an extensive number of treatment options, and is a complaint that carries with it a high risk of complications and morbidity.  The differential diagnosis includes tension headache, migraine, intracranial mass, intracranial hemorrhage, intracranial infection including meningitis vs encephalitis, trigeminal neuralgia, AVM, sinusitis, cerebral aneurysm, muscular headache, cavernous sinus thrombosis, carotid artery dissection.   Lab Tests: Patient declined labs and further workup against medical advice.    Imaging Studies ordered:  I ordered  imaging studies including head CT, CT cervical spine, left knee x-ray I independently visualized and interpreted imaging which showed no acute findings.  I agree with the radiologist interpretation   Cardiac monitor/EKG:  EKG showing old fibrillation with controlled rate.  This is new when compared to prior EKG.   Problem List / ED Course / Critical interventions / Medication management  Reports to emergency room with complaint of fall leading to head injury.  Patient reports that when she stood up her legs just gave out.  Unsure if she lost consciousness.  She reports this occurred probably 24 hours ago and she notes a mild headache.  On my exam she has no focal neurological deficits.  She denies any cervical midline tenderness step-off or deformity.  She is hemodynamically stable.  I did obtain an EKG which shows atrial fibrillation.  She has no history of atrial fibrillation and I do not see prior EKGs with atrial fibrillation.  She is not currently on a blood thinner.  I ultimately recommended that she stay for further workup including labs.  Patient reports that she does not want further labs or workup.  She is refusing to stay in the ER any longer.   She is leaving AGAINST MEDICAL ADVICE, I did extensively discuss risks of leaving and benefits of staying, family and patient reports they understand the risk of leaving and the benefits of staying, but they still would like to leave without further workup. They understand that she needs further evaluation and likely anticoagulation. They are not interested in anticoagulation at this time.  I offered medication however patient declined. I have reviewed the patients home medicines and have made adjustments as needed.   I will refer patient to A-fib clinic.  I encouraged her to return for further workup. She left against medical advice.         Final diagnoses:  Atrial fibrillation, unspecified type (HCC)  Fall, initial encounter    ED  Discharge Orders          Ordered    Ambulatory referral to Cardiology       Comments: If you have not heard from the Cardiology office within the next 72 hours please call [phone].   06/13/24 1811               Joslin Doell, Warren SAILOR, PA-C 06/13/24 1817    Neysa Caron PARAS, DO 06/18/24 [phone]

## 2024-06-13 NOTE — ED Triage Notes (Signed)
 PT arrives via wheelchair reporting that she fell down last night and struck the back of her head. PT states that she was confused about the time and date after the event, but now feels better. Pt denies blood thinners or aspirin.

## 2024-06-13 NOTE — ED Notes (Signed)
 Patient refused bloodwork

## 2024-06-13 NOTE — Progress Notes (Signed)
 Subjective   Pamela Braun is a 89 y.o. (DOB 09-05-1925) female.     Patient presents with  . Urinary Tract Infection    With blood x     History of Present Illness This is a 89 year old female presenting with confusion. She is accompanied by her son.  The patient reports experiencing confusion throughout the day, with an inability to discern the time or day. She also mentions difficulty in reading her phone screen. Her son corroborates her confusion, stating that she was unable to recall her daughter's name. The onset of her confusion was not immediately after the fall, as she did not notice it the previous day.  She recounts a fall that occurred on 06/12/2024 around 5:00 PM, during which she sustained a head injury resulting in swelling within 5 minutes. She did not lose consciousness following the fall. She was unable to seek immediate help due to a dead phone and medical alert device, necessitating her to scoot from the back door through the sunroom, utility room, kitchen, and bedroom to find assistance. She is not currently on any anticoagulant therapy. She also reports soreness on both sides of her neck, making it difficult for her to tolerate touch. She recalls a similar incident three weeks ago when she fell and experienced rapid swelling of the affected area. She did not undergo any radiographic imaging at that time.  She has been experiencing leg pain, which was previously managed by home health nurses until their services were discontinued two weeks ago.   Reviewed and updated this visit by provider: Tobacco  Allergies  Meds  Problems  Med Hx  Surg Hx  Fam Hx           Past Medical History:  Diagnosis Date  . Cataracts, bilateral   . Cluster headaches   . Disease of thyroid  gland    hypothyroid  . DVT (deep venous thrombosis) (*)    rle  . Hypertension   . Hypothyroidism 01/26/2012  . Macular degeneration   . Rosacea     Allergies[1]   Past  Surgical History:  Procedure Laterality Date  . Hysterectomy    . Joint replacement     right knee  . Knee surgery    . Shoulder arthroscopy    . Wrist surgery Left 08/15/2015    Family History  Problem Relation Age of Onset  . Diabetes Mother   . Heart disease Mother   . Heart disease Father   . Hypertension Father     Social History[2]   Review of Systems Review of Systems - All systems reviewed and are negative except what is noted in the HPI.   Objective   Vitals:   06/13/24 1324  BP: 130/76  Pulse: 66  Temp: 98.1 F (36.7 C)  Resp: 16  Height: 5' 4 (1.626 m)  Weight: 130 lb (59 kg)  SpO2: 96%  BMI (Calculated): 22.3    Physical Exam  Vital signs: Within normal limits. General appearance: Alert, appears stated age, no distress.  Walks with a cane HEENT: Redness and bruising on the back of the scalp.  No laceration.  PERRLA and EOMI. Respiratory: Clear to auscultate bilaterally Abdomen: BS active, no mass, tenderness, rebound.  No CVA tenderness bilaterally Neurological: Normal.  Strength equal bilaterally Heart: Regular rate and rhythm, S1, S2 normal, no murmur, click, rub or gallop. Skin: Skin color, texture, turgor normal. No rashes or lesions.    Results Laboratory Studies Urine test showed  leukocytes, no blood or nitrites, and it appeared slightly dark.  Results for orders placed or performed in visit on 06/13/24  POC Urine Dipstick   Collection Time: 06/13/24  1:33 PM  Result Value Ref Range   Color Yellow Yellow   Clarity Cloudy (A) Clear   Glucose,Urine Negative Negative mg/dL   Bilirubin Negative Negative   Ketones,Urine +/- (A) Negative mg/dL   Specific Gravity 8.979 1.005, 1.010, 1.015, 1.020, 1.025, 1.030   Blood Negative Negative   pH 6.0    Protein 1+ (A) Negative mg/dL   Urobilinogen 0.2 0.2, 1.0 EU/dL   Nitrite Negative Negative   Leukocyte Esterase 2+ (A) Negative    No results found.   Impression/Plan   Assessment &  Plan 1. Confusion: Acute. - CT scan of the head recommended to rule out intracranial bleeding. - Discussed the importance of immediate evaluation for potential intracranial bleeding. - Patient declined immediate transportation to the ER. - Prepared Against Medical Advice [facility]) form for patient to sign.  2. Head injury: Acute. - Noted redness and swelling on the back of the head. - CT scan of the head recommended to rule out intracranial bleeding. - Patient declined immediate transportation to the ER via EMS. - Against Medical Advice [facility]) form for patient to sign. Sharene Pizza, MA present during conversation on importance of going to the ER and risk of intercranial bleed/death  3. Urinary tract infection. - Leukocytes in urine, no blood or nitrites. - Will treat with keflex  , urine culture sent for analysis  Follow-up - CT scan of the head recommended and immediate ER. - Monitor for worsening symptoms.  1. Urine frequency (Primary) -     POC Urine Dipstick -     Culture, Urine Urine Urine, Clean Catch 2. Bacterial UTI -     cephALEXin  (KEFLEX ) 500 mg capsule; Take one capsule (500 mg dose) by mouth 2 (two) times daily for 7 days., Starting Fri 06/13/2024, Until Fri 06/20/2024, Normal 3. Fall, initial encounter 4. Cloudy urine -     Culture, Urine Urine Urine, Clean Catch     Risks, benefits, and alternatives of the medications and treatment plan prescribed today were discussed, and patient expressed understanding. Plan follow-up as discussed or as needed if any worsening symptoms or change in condition.      Attestation: Designer, fashion/clothing was used to create visit note. Consent from the patient/caregiver was obtained prior to its use.        [1] Allergies Allergen Reactions  . Anesthetics, Amide Nausea And Vomiting  . Bactrim [Sulfamethoxazole W/Trimethoprim] Rash  . Bactroban [Mupirocin Calcium] Rash  . Bactroban [Mupirocin] Rash  . Celebrex [Celecoxib] Rash  .  Codeine Nausea And Vomiting  . Demerol Nausea And Vomiting  . Ibuprofen Swelling  . Lasix [Furosemide] Rash  . Mycinettes Rash  . Naprosyn [Naproxen] Swelling  . Pentazocine Other  . Pentazocine-Naloxone Other  . Silver Nitrate Other     I dont know  . Zithromax [Azithromycin] Photosensitivity  [2] Social History Socioeconomic History  . Marital status: Widowed  Tobacco Use  . Smoking status: Never    Passive exposure: Current  . Smokeless tobacco: Never  Vaping Use  . Vaping status: Never Used  Substance and Sexual Activity  . Alcohol use: No  . Drug use: No  . Sexual activity: Not Currently

## 2024-06-13 NOTE — Discharge Instructions (Signed)
 You were seen in the emergency room today and found to be in atrial fibrillation.  You left without labs or coming up with a definitive treatment plan thus you left AGAINST MEDICAL ADVICE. Please return to ER for further evaluation.   Call cardiology for follow up.
